# Patient Record
Sex: Female | Born: 1995
Health system: Southern US, Community
[De-identification: ages and names within clinical notes are randomized; demographics above are authoritative.]

## PROBLEM LIST (undated history)

## (undated) DIAGNOSIS — Z789 Other specified health status: Secondary | ICD-10-CM

## (undated) DIAGNOSIS — M25572 Pain in left ankle and joints of left foot: Secondary | ICD-10-CM

## (undated) DIAGNOSIS — Z87898 Personal history of other specified conditions: Secondary | ICD-10-CM

## (undated) HISTORY — PX: NO PAST SURGERIES: SHX2092

## (undated) HISTORY — DX: Personal history of other specified conditions: Z87.898

## (undated) HISTORY — DX: Other specified health status: Z78.9

## (undated) HISTORY — DX: Pain in left ankle and joints of left foot: M25.572

---
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
## 2014-08-29 NOTE — H&P (Signed)
L&D Evaluation:  History:  HPI 19yo G1 at 4335w0d by D=22wk U/S presents in labor.  [facility], notable for [facility].  PNL - A+, RI, VI, GC and chlam negative, GBS pos.   Presents with contractions   Patient's Medical History No Chronic Illness   Patient's Surgical History none   Medications Pre Natal Vitamins  Iron   Allergies NKDA   Social History none   Family History Non-Contributory   ROS:  ROS All systems were reviewed.  HEENT, CNS, GI, GU, Respiratory, CV, Renal and Musculoskeletal systems were found to be normal.   Exam:  Vital Signs stable   General no apparent distress   Mental Status clear   Chest normal effort   Abdomen gravid, non-tender   Estimated Fetal Weight Average for gestational age, 7#   Pelvic 3-4 to 5cm over 2hour observation per RN   Mebranes Intact   FHT normal rate with no decels, reactive NST   Ucx every 2-3 min   Skin dry   Impression:  Impression 19yo G1 at 6535w0d in labor.   Plan:  Plan EFM/NST, monitor contractions and for cervical change, antibiotics for GBBS prophylaxis   Electronic Signatures: Garnette GunnerStansbury Clipp, Ali LoweEryn K (MD)  (Signed 19-Mar-14 15:03)  Authored: L&D Evaluation   Last Updated: 19-Mar-14 15:03 by Garnette GunnerStansbury Clipp, Ali LoweEryn K (MD)

## 2015-04-22 LAB — HM PAP SMEAR

## 2017-11-21 ENCOUNTER — Emergency Department
Admission: EM | Admit: 2017-11-21 | Discharge: 2017-11-21 | Disposition: A | Discharge status: Home / Self Care | Payer: BLUE CROSS/BLUE SHIELD | Attending: Emergency Medicine | Admitting: Emergency Medicine

## 2017-11-21 ENCOUNTER — Other Ambulatory Visit: Payer: Self-pay

## 2017-11-21 ENCOUNTER — Emergency Department: Payer: BLUE CROSS/BLUE SHIELD

## 2017-11-21 DIAGNOSIS — M25572 Pain in left ankle and joints of left foot: Secondary | ICD-10-CM

## 2017-11-21 DIAGNOSIS — S93402A Sprain of unspecified ligament of left ankle, initial encounter: Secondary | ICD-10-CM | POA: Insufficient documentation

## 2017-11-21 DIAGNOSIS — Y998 Other external cause status: Secondary | ICD-10-CM | POA: Diagnosis not present

## 2017-11-21 DIAGNOSIS — Z79899 Other long term (current) drug therapy: Secondary | ICD-10-CM | POA: Insufficient documentation

## 2017-11-21 DIAGNOSIS — Y929 Unspecified place or not applicable: Secondary | ICD-10-CM | POA: Insufficient documentation

## 2017-11-21 DIAGNOSIS — Y9302 Activity, running: Secondary | ICD-10-CM | POA: Diagnosis not present

## 2017-11-21 DIAGNOSIS — S99912A Unspecified injury of left ankle, initial encounter: Secondary | ICD-10-CM | POA: Diagnosis present

## 2017-11-21 DIAGNOSIS — W010XXA Fall on same level from slipping, tripping and stumbling without subsequent striking against object, initial encounter: Secondary | ICD-10-CM | POA: Insufficient documentation

## 2017-11-21 HISTORY — DX: Pain in left ankle and joints of left foot: M25.572

## 2017-11-21 MED ORDER — MELOXICAM 15 MG PO TABS: 15.0000 mg | ORAL_TABLET | Freq: Every day | ORAL | 2 refills | Status: AC

## 2017-11-21 NOTE — ED Triage Notes (Signed)
Patient reports "I was running and I did something to my ankle".  Patient reports left ankle pain.

## 2017-11-21 NOTE — ED Provider Notes (Signed)
[facility] Emergency Department Provider Note  ____________________________________________  Time seen: Approximately 11:32 PM  I have reviewed the triage vital signs and the nursing notes.   HISTORY  Chief Complaint Ankle Pain    HPI Janet Kane is a 22 y.o. female presents to the emergency department with 10 out of 10 left ankle pain after patient sustained an inversion type left ankle injury while running.  Patient has had prior left ankle sprains while playing softball.  She denies numbness and tingling of the left foot.  Patient did not hit her head during injury.  No skin compromise.  Patient has ambulated with some difficulty since incident occurred.  No alleviating measures have been attempted.   No past medical history on file.  There are no active problems to display for this patient.   No past surgical history on file.  Prior to Admission medications   Medication Sig Start Date End Date Taking? Authorizing Provider  acetaminophen (TYLENOL) 500 MG tablet Take 1,000 mg by mouth every 6 (six) hours as needed for pain.    [provider]  ferrous sulfate 325 (65 FE) MG tablet Take 325 mg by mouth daily with breakfast.    [provider]  ibuprofen (ADVIL,MOTRIN) 200 MG tablet Take 200 mg by mouth every 6 (six) hours as needed for pain.    [provider]  levonorgestrel (MIRENA) 20 MCG/24HR IUD 1 each by Intrauterine route once.    [provider]  meloxicam (MOBIC) 15 MG tablet Take 1 tablet (15 mg total) by mouth daily. 11/21/17 12/21/17  Orvil Feil, PA-C  naproxen (NAPROSYN) 375 MG tablet Take 1 tablet (375 mg total) by mouth 2 (two) times daily. 02/14/13   Marlon Pel, PA-C  Prenatal Vit-Fe Fumarate-FA (PRENATAL MULTIVITAMIN) TABS tablet Take 1 tablet by mouth daily at 12 noon.    [provider]    Allergies Patient has no known allergies.  No family history on file.  Social  History Social History   Tobacco Use  . Smoking status: Never Smoker  Substance Use Topics  . Alcohol use: No  . Drug use: Not on file     Review of Systems  Constitutional: No fever/chills Eyes: No visual changes. No discharge ENT: No upper respiratory complaints. Cardiovascular: no chest pain. Respiratory: no cough. No SOB. Gastrointestinal: No abdominal pain.  No nausea, no vomiting.  No diarrhea.  No constipation. Musculoskeletal: Patient has left ankle pain.  Skin: Negative for rash, abrasions, lacerations, ecchymosis. Neurological: Negative for headaches, focal weakness or numbness.   ____________________________________________   PHYSICAL EXAM:  VITAL SIGNS: ED Triage Vitals  Enc Vitals Group     BP 11/21/17 2021 100/65     Pulse Rate 11/21/17 2021 98     Resp 11/21/17 2021 17     Temp 11/21/17 2021 98.7 F (37.1 C)     Temp Source 11/21/17 2021 Oral     SpO2 11/21/17 2021 97 %     Weight 11/21/17 2238 140 lb (63.5 kg)     Height 11/21/17 2238 5\' 6"  (1.676 m)     Head Circumference --      Peak Flow --      Pain Score 11/21/17 2021 7     Pain Loc --      Pain Edu? --      Excl. in GC? --      Constitutional: Alert and oriented. Well appearing and in no acute distress. Eyes: Conjunctivae  are normal. PERRL. EOMI. Head: Atraumatic. Cardiovascular: Normal rate, regular rhythm. Normal S1 and S2.  Good peripheral circulation. Respiratory: Normal respiratory effort without tachypnea or retractions. Lungs CTAB. Good air entry to the bases with no decreased or absent breath sounds. Musculoskeletal: Patient is able to perform limited range of motion at the left ankle, likely secondary to pain.  Patient has exquisite tenderness and edema over the anterior and posterior talofibular ligaments.  No significant pain over the deltoid ligament.  No pain with palpation over the metatarsals or over the calcaneus.  Palpable dorsalis pedis pulse, left. Neurologic:  Normal  speech and language. No gross focal neurologic deficits are appreciated.  Skin:  Skin is warm, dry and intact. No rash noted. Psychiatric: Mood and affect are normal. Speech and behavior are normal. Patient exhibits appropriate insight and judgement.   ____________________________________________   LABS (all labs ordered are listed, but only abnormal results are displayed)  Labs Reviewed - No data to display ____________________________________________  EKG   ____________________________________________  RADIOLOGY I personally viewed and evaluated these images as part of my medical decision making, as well as reviewing the written report by the radiologist  Dg Ankle Complete Left  Result Date: 11/21/2017 CLINICAL DATA:  Swelling after twisting injury this afternoon. EXAM: LEFT ANKLE COMPLETE - 3+ VIEW COMPARISON:  None. FINDINGS: Marked soft tissue swelling of the distal leg and ankle with small ankle joint effusion. No fracture nor joint dislocation. The base of fifth metatarsal appears intact. The tibiotalar, subtalar and midfoot articulations are congruent. IMPRESSION: Soft tissue swelling of the lateral left leg and ankle without acute osseous appearing abnormality. Electronically Signed   By: Tollie Ethavid  Kwon M.D.   On: 11/21/2017 20:44    ____________________________________________    PROCEDURES  Procedure(s) performed:    Procedures    Medications - No data to display   ____________________________________________   INITIAL IMPRESSION / ASSESSMENT AND PLAN / ED COURSE  Pertinent labs & imaging results that were available during my care of the patient were reviewed by me and considered in my medical decision making (see chart for details).  Review of the [place] CSRS was performed in accordance of the [facility] prior to dispensing any controlled drugs.      Assessment and plan Left ankle pain Patient presents to the emergency department with left ankle pain after  sustaining an inversion type ankle injury while running.   X-ray examination reveals no acute bony abnormality.  Physical exam findings are consistent with a left ankle sprain.  An Ace wrap was applied in the emergency department and patient was discharged with meloxicam after she denied the possibility of pregnancy. Patient also declined crutches.  She was advised to follow-up with podiatry as needed.  All patient questions were answered.    ____________________________________________  FINAL CLINICAL IMPRESSION(S) / ED DIAGNOSES  Final diagnoses:  Sprain of left ankle, unspecified ligament, initial encounter      NEW MEDICATIONS STARTED DURING THIS VISIT:  ED Discharge Orders        Ordered    meloxicam (MOBIC) 15 MG tablet  Daily     11/21/17 2239          This chart was dictated using voice recognition software/Dragon. Despite best efforts to proofread, errors can occur which can change the meaning. Any change was purely unintentional.    Orvil FeilWoods, Jakeisha Stricker M, PA-C 11/21/17 [id number]    Dionne BucySiadecki, Sebastian, MD 11/21/17 [phone]

## 2018-04-22 ENCOUNTER — Encounter: Payer: Self-pay | Admitting: Family Medicine

## 2018-04-22 ENCOUNTER — Other Ambulatory Visit: Payer: Self-pay

## 2018-04-23 ENCOUNTER — Encounter: Payer: Self-pay | Admitting: Family Medicine

## 2018-04-23 ENCOUNTER — Ambulatory Visit (INDEPENDENT_AMBULATORY_CARE_PROVIDER_SITE_OTHER): Payer: No Typology Code available for payment source | Admitting: Family Medicine

## 2018-04-23 VITALS — BP 120/60 | HR 84 | Ht 66.0 in | Wt 132.0 lb

## 2018-04-23 DIAGNOSIS — Z30011 Encounter for initial prescription of contraceptive pills: Secondary | ICD-10-CM

## 2018-04-23 DIAGNOSIS — Z7689 Persons encountering health services in other specified circumstances: Secondary | ICD-10-CM | POA: Diagnosis not present

## 2018-04-23 MED ORDER — DROSPIRENONE-ETHINYL ESTRADIOL 3-0.02 MG PO TABS: 1.0000 | ORAL_TABLET | Freq: Every day | ORAL | 6 refills | Status: DC

## 2018-04-23 NOTE — Progress Notes (Signed)
Date:  04/23/2018   Name:  Janet Kane   DOB:  [birth year]   MRN:  [id number]   Chief Complaint: Establish Care and Contraception (had IUD taken out July 2014. Has been using condoms since- would like to discuss further options. )  Patient is a 23 year old female who presents for a comprehensive physical exam. The patient reports the following problems: contraceptive . Health maintenance has been reviewed pap.   Review of Systems  Constitutional: Negative.  Negative for chills, fatigue, fever and unexpected weight change.  HENT: Negative for congestion, ear discharge, ear pain, rhinorrhea, sinus pressure, sneezing and sore throat.   Eyes: Negative for photophobia, pain, discharge, redness and itching.  Respiratory: Negative for cough, shortness of breath, wheezing and stridor.   Gastrointestinal: Negative for abdominal pain, blood in stool, constipation, diarrhea, nausea and vomiting.  Endocrine: Negative for cold intolerance, heat intolerance, polydipsia, polyphagia and polyuria.  Genitourinary: Negative for dysuria, flank pain, frequency, hematuria, menstrual problem, pelvic pain, urgency, vaginal bleeding and vaginal discharge.  Musculoskeletal: Negative for arthralgias, back pain and myalgias.  Skin: Negative for rash.  Allergic/Immunologic: Negative for environmental allergies and food allergies.  Neurological: Negative for dizziness, weakness, light-headedness, numbness and headaches.  Hematological: Negative for adenopathy. Does not bruise/bleed easily.  Psychiatric/Behavioral: Negative for dysphoric mood. The patient is not nervous/anxious.     Patient Active Problem List   Diagnosis Date Noted  . Acute chest pain 12/27/2013  . Anxiety 12/27/2013  . Health care maintenance 10/03/2011    No Known Allergies  History reviewed. No pertinent surgical history.  Social History   Tobacco Use  . Smoking status: Never Smoker  . Smokeless tobacco: Never Used    Substance Use Topics  . Alcohol use: Yes    Alcohol/week: 3.0 standard drinks    Types: 3 Standard drinks or equivalent per week  . Drug use: Never     Medication list has been reviewed and updated.  No outpatient medications have been marked as taking for the 04/23/18 encounter (Office Visit) with Duanne LimerickJones,  C, MD.    [facility]  PHQ - 2 Score 0  PHQ- 9 Score 0    Physical Exam Vitals signs and nursing note reviewed.  Constitutional:      General: She is not in acute distress.    Appearance: She is not diaphoretic.  HENT:     Head: Normocephalic and atraumatic.     Right Ear: External ear normal.     Left Ear: External ear normal.     Nose: Nose normal.  Eyes:     General:        Right eye: No discharge.        Left eye: No discharge.     Conjunctiva/sclera: Conjunctivae normal.     Pupils: Pupils are equal, round, and reactive to light.  Neck:     Musculoskeletal: Normal range of motion and neck supple.     Thyroid: No thyromegaly.     Vascular: No JVD.  Cardiovascular:     Rate and Rhythm: Normal rate and regular rhythm.     Heart sounds: Normal heart sounds. No murmur. No friction rub. No gallop.   Pulmonary:     Effort: Pulmonary effort is normal.     Breath sounds: Normal breath sounds.  Abdominal:     General: Bowel sounds are normal.     Palpations: Abdomen is soft. There is no mass.     Tenderness:  There is no abdominal tenderness. There is no guarding.  Musculoskeletal: Normal range of motion.  Lymphadenopathy:     Cervical: No cervical adenopathy.  Skin:    General: Skin is warm and dry.  Neurological:     Mental Status: She is alert.     Deep Tendon Reflexes: Reflexes are normal and symmetric.     BP 120/60   Pulse 84   Ht 5\' 6"  (1.676 m)   Wt 132 lb (59.9 kg)   LMP 04/03/2018 (Exact Date)   BMI 21.31 kg/m   Assessment and Plan: 1. Encounter for initial prescription of contraceptive pills Contraceptive methods discussed  with patient patient would like to start on oral contraception discussed the risks benefits information provided patient was started on the as 1 a day for 4 weeks with 6 refills patient is to set up for a Pap smear bimanual exam in the near future. - drospirenone-ethinyl estradiol (YAZ,GIANVI,LORYNA) 3-0.02 MG tablet; Take 1 tablet by mouth daily.  Dispense: 1 Package; Refill: 6  2. Encounter to establish care Patient to establish care with new physician.

## 2018-04-23 NOTE — Patient Instructions (Signed)
Oral Contraception Information Oral contraceptive pills (OCPs) are medicines taken to prevent pregnancy. OCPs are taken by mouth, and they work by:  Preventing the ovaries from releasing eggs.  Thickening mucus in the lower part of the uterus (cervix), which prevents sperm from entering the uterus.  Thinning the lining of the uterus (endometrium), which prevents a fertilized egg from attaching to the endometrium. OCPs are highly effective when taken exactly as prescribed. However, OCPs do not prevent STIs (sexually transmitted infections). Safe sex practices, such as using condoms while on an OCP, can help prevent STIs. Before starting OCPs Before you start taking OCPs, you may have a physical exam, blood test, and Pap test. However, you are not required to have a pelvic exam in order to be prescribed OCPs. Your health care provider will make sure you are a good candidate for oral contraception. OCPs are not a good option for certain women, including women who smoke and are older than 35 years, and women with a medical history of high blood pressure, deep vein thrombosis, pulmonary embolism, stroke, cardiovascular disease, or peripheral vascular disease. Discuss with your health care provider the possible side effects of the OCP you may be prescribed. When you start an OCP, be aware that it can take 2-3 months for your body to adjust to changes in hormone levels. Follow instructions from your health care provider about how to start taking your first cycle of OCPs. Depending on when you start the pill, you may need to use a backup form of birth control, such as condoms, during the first week. Make sure you know what steps to take if you ever forget to take the pill. Types of oral contraception  The most common types of birth control pills contain the hormones estrogen and progestin (synthetic progesterone) or progestin only. The combination pill This type of pill contains estrogen and progestin  hormones. Combination pills often come in packs of 21, 28, or 91 pills. For each pack, the last 7 pills may not contain hormones, which means you may stop taking the pills for 7 days. Menstrual bleeding occurs during the week that you do not take the pills or that you take the pills with no hormones in them. The minipill This type of pill contains the progestin hormone only. It comes in packs of 28 pills. All 28 pills contain the hormone. You take the pill every day. It is very important to take the pill at the same time each day. Advantages of oral contraceptive pills  Provides reliable and continuous contraception if taken as instructed.  May treat or decrease symptoms of: ? Menstrual period cramps. ? Irregular menstrual cycle or bleeding. ? Heavy menstrual flow. ? Abnormal uterine bleeding. ? Acne, depending on the type of pill. ? Polycystic ovarian syndrome. ? Endometriosis. ? Iron deficiency anemia. ? Premenstrual symptoms, including premenstrual dysphoric disorder.  May reduce the risk of endometrial and ovarian cancer.  Can be used as emergency contraception.  Prevents mislocated (ectopic) pregnancies and infections of the fallopian tubes. Things that can make oral contraceptive pills less effective OCPs can be less effective if:  You forget to take the pill at the same time every day. This is especially important when taking the minipill.  You have a stomach or intestinal disease that reduces your body's ability to absorb the pill.  You take OCPs with other medicines that make OCPs less effective, such as antibiotics, certain HIV medicines, and some seizure medicines.  You take expired OCPs.    You forget to restart the pill on day 7, if using the packs of 21 pills. Risks associated with oral contraceptive pills Oral contraceptive pills can sometimes cause side effects, such as:  Headache.  Depression.  Trouble sleeping.  Nausea and vomiting.  Breast  tenderness.  Irregular bleeding or spotting during the first several months.  Bloating or fluid retention.  Increase in blood pressure. Combination pills are also associated with a small increase in the risk of:  Blood clots.  Heart attack.  Stroke. Summary  Oral contraceptive pills are medicines taken by mouth to prevent pregnancy. They are highly effective when taken exactly as prescribed.  The most common types of birth control pills contain the hormones estrogen and progestin (synthetic progesterone) or progestin only.  Before you start taking the pill, you may have a physical exam, blood test, and Pap test. Your health care provider will make sure you are a good candidate for oral contraception.  The combination pill may come in a 21-day pack, a 28-day pack, or a 91-day pack. The minipill contains the progesterone hormone only and comes in packs of 28 pills.  Oral contraceptive pills can sometimes cause side effects, such as headache, nausea, breast tenderness, or irregular bleeding. This information is not intended to replace advice given to you by your health care provider. Make sure you discuss any questions you have with your health care provider. Document Released: 06/28/2002 Document Revised: 07/01/2016 Document Reviewed: 07/01/2016 Elsevier Interactive Patient Education  2019 [employer] Drospirenone; Ethinyl Estradiol tablets What is this medicine? DROSPIRENONE; ETHINYL ESTRADIOL (dro SPY re nown; ETH in il es tra DYE ole) is an oral contraceptive (birth control pill). This medicine combines two types of female hormones, an estrogen and a progestin. It is used to prevent ovulation and pregnancy. This medicine may be used for other purposes; ask your health care provider or pharmacist if you have questions. COMMON BRAND NAME(S): Ovidio Hanger, Lo-Zumandimine, Elmer Ramp 28-Day, Ocella, Syeda, Vestura, Alphonse Guild, Zumandimine What should I tell my health  care provider before I take this medicine? They need to know if you have or ever had any of these conditions: -abnormal vaginal bleeding -adrenal gland disease -blood vessel disease or blood clots -breast, cervical, endometrial, ovarian, liver, or uterine cancer -diabetes -gallbladder disease -heart disease or recent heart attack -high blood pressure -high cholesterol -high potassium level -kidney disease -liver disease -migraine headaches -stroke -systemic lupus erythematosus (SLE) -tobacco smoker -an unusual or allergic reaction to estrogens, progestins, or other medicines, foods, dyes, or preservatives -pregnant or trying to get pregnant -breast-feeding How should I use this medicine? Take this medicine by mouth. To reduce nausea, this medicine may be taken with food. Follow the directions on the prescription label. Take this medicine at the same time each day and in the order directed on the package. Do not take your medicine more often than directed. A patient package insert for the product will be given with each prescription and refill. Read this sheet carefully each time. The sheet may change frequently. Talk to your pediatrician regarding the use of this medicine in children. Special care may be needed. This medicine has been used in female children who have started having menstrual periods. Overdosage: If you think you have taken too much of this medicine contact a poison control center or emergency room at once. NOTE: This medicine is only for you. Do not share this medicine with others. What if I miss a dose? If you miss a  dose, refer to the patient information sheet you received with your medicine for direction. If you miss more than one pill, this medicine may not be as effective and you may need to use another form of birth control. What may interact with this medicine? Do not take this medicine with any of the following medications: -aminoglutethimide -amprenavir,  fosamprenavir -atazanavir; cobicistat -anastrozole -bosentan -exemestane -letrozole -metyrapone -testolactone This medicine may also interact with the following medications: -acetaminophen -antiviral medicines for HIV or AIDS -aprepitant -barbiturates -certain antibiotics like rifampin, rifabutin, rifapentine, and possibly penicillins or tetracyclines -certain diuretics like amiloride, spironolactone, triamterene -certain medicines for fungal infections like griseofulvin, ketoconazole, itraconazole -certain medications for high blood pressure or heart conditions like ACE-inhibitors, Angiotensin-II receptor blockers, eplerenone -certain medicines for seizures like carbamazepine, oxcarbazepine, phenobarbital, phenytoin -cholestyramine -cobicistat -corticosteroid like hydrocortisone and prednisolone -cyclosporine -dantrolene -felbamate -grapefruit juice -heparin -lamotrigine -medicines for diabetes, including pioglitazone -modafinil -NSAIDs -potassium supplements -pyrimethamine -raloxifene -St. John's wort -sulfasalazine -tamoxifen -topiramate -thyroid hormones -warfarin his list may not describe all possible interactions. Give your health care provider a list of all the medicines, herbs, non-prescription drugs, or dietary supplements you use. Also tell them if you smoke, drink alcohol, or use illegal drugs. Some items may interact with your medicine. This list may not describe all possible interactions. Give your health care provider a list of all the medicines, herbs, non-prescription drugs, or dietary supplements you use. Also tell them if you smoke, drink alcohol, or use illegal drugs. Some items may interact with your medicine. What should I watch for while using this medicine? Visit your doctor or health care professional for regular checks on your progress. You will need a regular breast and pelvic exam and Pap smear while on this medicine. Use an additional method of  contraception during the first cycle that you take these tablets. If you have any reason to think you are pregnant, stop taking this medicine right away and contact your doctor or health care professional. If you are taking this medicine for hormone related problems, it may take several cycles of use to see improvement in your condition. Smoking increases the risk of getting a blood clot or having a stroke while you are taking birth control pills, especially if you are more than 24 years old. You are strongly advised not to smoke. This medicine can make your body retain fluid, making your fingers, hands, or ankles swell. Your blood pressure can go up. Contact your doctor or health care professional if you feel you are retaining fluid. This medicine can make you more sensitive to the sun. Keep out of the sun. If you cannot avoid being in the sun, wear protective clothing and use sunscreen. Do not use sun lamps or tanning beds/booths. If you wear contact lenses and notice visual changes, or if the lenses begin to feel uncomfortable, consult your eye care specialist. In some women, tenderness, swelling, or minor bleeding of the gums may occur. Notify your dentist if this happens. Brushing and flossing your teeth regularly may help limit this. See your dentist regularly and inform your dentist of the medicines you are taking. If you are going to have elective surgery, you may need to stop taking this medicine before the surgery. Consult your health care professional for advice. This medicine does not protect you against HIV infection (AIDS) or any other sexually transmitted diseases. What side effects may I notice from receiving this medicine? Side effects that you should report to your doctor or health care  professional as soon as possible: -allergic reactions like skin rash, itching or hives, swelling of the face, lips, or tongue -breast tissue changes or discharge -changes in vision -chest  pain -confusion, trouble speaking or understanding -dark urine -general ill feeling or flu-like symptoms -light-colored stools -nausea, vomiting -pain, swelling, warmth in the leg -right upper belly pain -severe headaches -shortness of breath -sudden numbness or weakness of the face, arm or leg -trouble walking, dizziness, loss of balance or coordination -unusual vaginal bleeding -yellowing of the eyes or skin Side effects that usually do not require medical attention (report to your doctor or health care professional if they continue or are bothersome): -acne -brown spots on the face -change in appetite -change in sexual desire -depressed mood or mood swings -fluid retention and swelling -stomach cramps or bloating -unusually weak or tired -weight gain This list may not describe all possible side effects. Call your doctor for medical advice about side effects. You may report side effects to FDA at 1-800-FDA-1088. Where should I keep my medicine? Keep out of the reach of children. Store at room temperature between 15 and 30 degrees C (59 and 86 degrees F). Throw away any unused medicine after the expiration date. NOTE: This sheet is a summary. It may not cover all possible information. If you have questions about this medicine, talk to your doctor, pharmacist, or health care provider.  2019 Elsevier/Gold Standard (2015-12-28 13:52:56)

## 2018-05-14 ENCOUNTER — Other Ambulatory Visit: Payer: Self-pay

## 2018-09-02 ENCOUNTER — Telehealth: Payer: No Typology Code available for payment source | Admitting: Physician Assistant

## 2018-09-02 DIAGNOSIS — R300 Dysuria: Secondary | ICD-10-CM

## 2018-09-02 MED ORDER — CEPHALEXIN 500 MG PO CAPS: 500.0000 mg | ORAL_CAPSULE | Freq: Two times a day (BID) | ORAL | 0 refills | Status: AC

## 2018-09-02 NOTE — Progress Notes (Signed)

## 2018-09-02 NOTE — Progress Notes (Signed)
I have spent 5 minutes in review of e-visit questionnaire, review and updating patient chart, medical decision making and response to patient.   Kandise Riehle Cody Kimi Kroft, PA-C    

## 2018-10-21 ENCOUNTER — Other Ambulatory Visit: Payer: Self-pay

## 2018-10-21 DIAGNOSIS — Z30011 Encounter for initial prescription of contraceptive pills: Secondary | ICD-10-CM

## 2018-10-21 MED ORDER — DROSPIRENONE-ETHINYL ESTRADIOL 3-0.02 MG PO TABS: 1.0000 | ORAL_TABLET | Freq: Every day | ORAL | 0 refills | Status: DC

## 2018-11-09 ENCOUNTER — Other Ambulatory Visit (HOSPITAL_COMMUNITY)
Admission: RE | Admit: 2018-11-09 | Discharge: 2018-11-09 | Disposition: A | Discharge status: Home / Self Care | Payer: No Typology Code available for payment source | Source: Ambulatory Visit | Attending: Advanced Practice Midwife | Admitting: Advanced Practice Midwife

## 2018-11-09 ENCOUNTER — Other Ambulatory Visit: Payer: Self-pay

## 2018-11-09 ENCOUNTER — Ambulatory Visit (INDEPENDENT_AMBULATORY_CARE_PROVIDER_SITE_OTHER): Payer: No Typology Code available for payment source | Admitting: Advanced Practice Midwife

## 2018-11-09 ENCOUNTER — Encounter: Payer: Self-pay | Admitting: Advanced Practice Midwife

## 2018-11-09 VITALS — BP 122/74 | Ht 66.0 in | Wt 140.0 lb

## 2018-11-09 DIAGNOSIS — Z124 Encounter for screening for malignant neoplasm of cervix: Secondary | ICD-10-CM

## 2018-11-09 DIAGNOSIS — Z113 Encounter for screening for infections with a predominantly sexual mode of transmission: Secondary | ICD-10-CM | POA: Diagnosis present

## 2018-11-09 DIAGNOSIS — Z01419 Encounter for gynecological examination (general) (routine) without abnormal findings: Secondary | ICD-10-CM | POA: Insufficient documentation

## 2018-11-09 DIAGNOSIS — Z3041 Encounter for surveillance of contraceptive pills: Secondary | ICD-10-CM

## 2018-11-09 MED ORDER — DROSPIRENONE-ETHINYL ESTRADIOL 3-0.02 MG PO TABS: 1.0000 | ORAL_TABLET | Freq: Every day | ORAL | 4 refills | Status: DC

## 2018-11-09 NOTE — Patient Instructions (Signed)
Health Maintenance, Female Adopting a healthy lifestyle and getting preventive care are important in promoting health and wellness. Ask your health care provider about:  The right schedule for you to have regular tests and exams.  Things you can do on your own to prevent diseases and keep yourself healthy. What should I know about diet, weight, and exercise? Eat a healthy diet   Eat a diet that includes plenty of vegetables, fruits, low-fat dairy products, and lean protein.  Do not eat a lot of foods that are high in solid fats, added sugars, or sodium. Maintain a healthy weight Body mass index (BMI) is used to identify weight problems. It estimates body fat based on height and weight. Your health care provider can help determine your BMI and help you achieve or maintain a healthy weight. Get regular exercise Get regular exercise. This is one of the most important things you can do for your health. Most adults should:  Exercise for at least 150 minutes each week. The exercise should increase your heart rate and make you sweat (moderate-intensity exercise).  Do strengthening exercises at least twice a week. This is in addition to the moderate-intensity exercise.  Spend less time sitting. Even light physical activity can be beneficial. Watch cholesterol and blood lipids Have your blood tested for lipids and cholesterol at 24 years of age, then have this test every 5 years. Have your cholesterol levels checked more often if:  Your lipid or cholesterol levels are high.  You are older than 24 years of age.  You are at high risk for heart disease. What should I know about cancer screening? Depending on your health history and family history, you may need to have cancer screening at various ages. This may include screening for:  Breast cancer.  Cervical cancer.  Colorectal cancer.  Skin cancer.  Lung cancer. What should I know about heart disease, diabetes, and high blood  pressure? Blood pressure and heart disease  High blood pressure causes heart disease and increases the risk of stroke. This is more likely to develop in people who have high blood pressure readings, are of African descent, or are overweight.  Have your blood pressure checked: ? Every 3-5 years if you are 18-39 years of age. ? Every year if you are 40 years old or older. Diabetes Have regular diabetes screenings. This checks your fasting blood sugar level. Have the screening done:  Once every three years after age 40 if you are at a normal weight and have a low risk for diabetes.  More often and at a younger age if you are overweight or have a high risk for diabetes. What should I know about preventing infection? Hepatitis B If you have a higher risk for hepatitis B, you should be screened for this virus. Talk with your health care provider to find out if you are at risk for hepatitis B infection. Hepatitis C Testing is recommended for:  Everyone born from 1945 through 1965.  Anyone with known risk factors for hepatitis C. Sexually transmitted infections (STIs)  Get screened for STIs, including gonorrhea and chlamydia, if: ? You are sexually active and are younger than 24 years of age. ? You are older than 24 years of age and your health care provider tells you that you are at risk for this type of infection. ? Your sexual activity has changed since you were last screened, and you are at increased risk for chlamydia or gonorrhea. Ask your health care provider if   you are at risk.  Ask your health care provider about whether you are at high risk for HIV. Your health care provider may recommend a prescription medicine to help prevent HIV infection. If you choose to take medicine to prevent HIV, you should first get tested for HIV. You should then be tested every 3 months for as long as you are taking the medicine. Pregnancy  If you are about to stop having your period (premenopausal) and  you may become pregnant, seek counseling before you get pregnant.  Take 400 to 800 micrograms (mcg) of folic acid every day if you become pregnant.  Ask for birth control (contraception) if you want to prevent pregnancy. Osteoporosis and menopause Osteoporosis is a disease in which the bones lose minerals and strength with aging. This can result in bone fractures. If you are 65 years old or older, or if you are at risk for osteoporosis and fractures, ask your health care provider if you should:  Be screened for bone loss.  Take a calcium or vitamin D supplement to lower your risk of fractures.  Be given hormone replacement therapy (HRT) to treat symptoms of menopause. Follow these instructions at home: Lifestyle  Do not use any products that contain nicotine or tobacco, such as cigarettes, e-cigarettes, and chewing tobacco. If you need help quitting, ask your health care provider.  Do not use street drugs.  Do not share needles.  Ask your health care provider for help if you need support or information about quitting drugs. Alcohol use  Do not drink alcohol if: ? Your health care provider tells you not to drink. ? You are pregnant, may be pregnant, or are planning to become pregnant.  If you drink alcohol: ? Limit how much you use to 0-1 drink a day. ? Limit intake if you are breastfeeding.  Be aware of how much alcohol is in your drink. In the [place] one drink equals one 12 oz bottle of beer (355 mL), one 5 oz glass of wine (148 mL), or one 1 oz glass of hard liquor (44 mL). General instructions  Schedule regular health, dental, and eye exams.  Stay current with your vaccines.  Tell your health care provider if: ? You often feel depressed. ? You have ever been abused or do not feel safe at home. Summary  Adopting a healthy lifestyle and getting preventive care are important in promoting health and wellness.  Follow your health care provider's instructions about healthy  diet, exercising, and getting tested or screened for diseases.  Follow your health care provider's instructions on monitoring your cholesterol and blood pressure. This information is not intended to replace advice given to you by your health care provider. Make sure you discuss any questions you have with your health care provider. Document Released: 10/21/2010 Document Revised: 03/31/2018 Document Reviewed: 03/31/2018 Elsevier Patient Education  2020 [employer]  

## 2018-11-09 NOTE — Progress Notes (Signed)
Gynecology Annual Exam  PCP: [name], MD  Chief Complaint:  Chief Complaint  Patient presents with  . Annual Exam  . Contraception    History of Present Illness: Patient is a 23 y.o. G1P1001 presents for annual exam. The patient has no gyn complaints today. She does wonder if it is normal to have cyclical changes in the amount of vaginal discharge. Discussed normal changes in cervical mucous/discharge.   LMP: Patient's last menstrual period was 10/04/2018. Menarche:13 Average Interval: regular, 28 days Duration of flow: 4 days Heavy Menses: no Clots: no Intermenstrual Bleeding: no Postcoital Bleeding: no Dysmenorrhea: no  The patient is sexually active. She currently uses OCP (estrogen/progesterone) for contraception. She denies dyspareunia.  The patient does perform self breast exams.  There is no notable family history of breast or ovarian cancer in her family.  The patient wears seatbelts: yes.  The patient has regular exercise: she runs 3 days per week and is active at her job and with her 72 year old daughter. She admits healthy diet, adequate hydration and adequate sleep although she works night shift.  She is an Therapist, sports at [employer].  The patient denies current symptoms of depression.    Review of Systems: Review of Systems  Constitutional: Negative.   HENT: Negative.   Eyes: Negative.   Respiratory: Negative.   Cardiovascular: Negative.   Gastrointestinal: Negative.   Genitourinary: Negative.   Musculoskeletal: Negative.   Skin: Negative.   Neurological: Negative.   Endo/Heme/Allergies: Negative.   Psychiatric/Behavioral: Negative.     Past Medical History:  Past Medical History:  Diagnosis Date  . Hx of chest pain   . Left ankle pain 11/21/2017  . Uses birth control     Past Surgical History:  History reviewed. No pertinent surgical history.  Gynecologic History:  Patient's last menstrual period was 10/04/2018. Contraception: OCP  (estrogen/progesterone) Last Pap: 4 years ago Results were:  no abnormalities   Obstetric History: G1P1001  Family History:  History reviewed. No pertinent family history.  Social History:  Social History   Socioeconomic History  . Marital status: Single    Spouse name: Not on file  . Number of children: Not on file  . Years of education: Not on file  . Highest education level: Not on file  Occupational History  . Not on file  Social Needs  . Financial resource strain: Not on file  . Food insecurity    Worry: Not on file    Inability: Not on file  . Transportation needs    Medical: Not on file    Non-medical: Not on file  Tobacco Use  . Smoking status: Never Smoker  . Smokeless tobacco: Never Used  Substance and Sexual Activity  . Alcohol use: Yes    Alcohol/week: 3.0 standard drinks    Types: 3 Standard drinks or equivalent per week  . Drug use: Never  . Sexual activity: Yes    Birth control/protection: Pill  Lifestyle  . Physical activity    Days per week: Not on file    Minutes per session: Not on file  . Stress: Not on file  Relationships  . Social Herbalist on phone: Not on file    Gets together: Not on file    Attends religious service: Not on file    Active member of club or organization: Not on file    Attends meetings of clubs or organizations: Not on file  Relationship status: Not on file  . Intimate partner violence    Fear of current or ex partner: Not on file    Emotionally abused: Not on file    Physically abused: Not on file    Forced sexual activity: Not on file  Other Topics Concern  . Not on file  Social History Narrative  . Not on file    Allergies:  No Known Allergies  Medications: Prior to Admission medications   Medication Sig Start Date End Date Taking? Authorizing Provider  drospirenone-ethinyl estradiol (YAZ) 3-0.02 MG tablet Take 1 tablet by mouth daily. 11/09/18   Tresea MallGledhill, Cheryel Kyte, CNM    Physical Exam Vitals:  Blood pressure 122/74, height 5\' 6"  (1.676 m), weight 140 lb (63.5 kg), last menstrual period 10/04/2018.  General: NAD HEENT: normocephalic, anicteric Thyroid: no enlargement, no palpable nodules Pulmonary: No increased work of breathing, CTAB Cardiovascular: RRR, distal pulses 2+ Breast: Breast symmetrical, no tenderness, no palpable nodules or masses, no skin or nipple retraction present, no nipple discharge.  No axillary or supraclavicular lymphadenopathy. Abdomen: NABS, soft, non-tender, non-distended.  Umbilicus without lesions.  No hepatomegaly, splenomegaly or masses palpable. No evidence of hernia  Genitourinary:  External: Normal external female genitalia.  Normal urethral meatus, normal Bartholin's and Skene's glands.    Vagina: Normal vaginal mucosa, no evidence of prolapse.    Cervix: Grossly normal in appearance, no bleeding  Uterus: deferred for no concerns  Adnexa: deferred for no concerns  Rectal: deferred  Lymphatic: no evidence of inguinal lymphadenopathy Extremities: no edema, erythema, or tenderness Neurologic: Grossly intact Psychiatric: mood appropriate, affect full    Assessment: 23 y.o. G1P1001 routine annual exam  Plan: Problem List Items Addressed This Visit    None    Visit Diagnoses    Well woman exam with routine gynecological exam    -  Primary   Relevant Orders   Cytology - PAP   Cervical cancer screening       Relevant Orders   Cytology - PAP   Screen for sexually transmitted diseases       Relevant Orders   Cytology - PAP   Encounter for surveillance of contraceptive pills       Relevant Medications   drospirenone-ethinyl estradiol (YAZ) 3-0.02 MG tablet      1) 4) Gardasil Series discussed and if applicable offered to patient - Patient has not previously completed 3 shot series   2) STI screening  was offered and accepted  3)  ASCCP guidelines and rationale discussed.  Patient opts for every 3 years screening interval  4)  Contraception - the patient is currently using  OCP (estrogen/progesterone).  She is happy with her current form of contraception and plans to continue We discussed safe sex practices to reduce her furture risk of STI's.    5) Return in about 1 year (around 11/09/2019) for annual established gyn.    Tresea MallJane Yaseen Gilberg, CNM [facility] 11/09/2018, 4:33 PM

## 2018-11-13 LAB — CYTOLOGY - PAP
Chlamydia: NEGATIVE
Diagnosis: UNDETERMINED — AB
HPV: NOT DETECTED
Neisseria Gonorrhea: NEGATIVE
Trichomonas: NEGATIVE

## 2018-11-23 ENCOUNTER — Other Ambulatory Visit: Payer: Self-pay

## 2019-03-07 ENCOUNTER — Telehealth: Payer: No Typology Code available for payment source | Admitting: Physician Assistant

## 2019-03-07 DIAGNOSIS — R300 Dysuria: Secondary | ICD-10-CM | POA: Diagnosis not present

## 2019-03-07 MED ORDER — CEPHALEXIN 500 MG PO CAPS: 500.0000 mg | ORAL_CAPSULE | Freq: Two times a day (BID) | ORAL | 0 refills | Status: AC

## 2019-03-07 NOTE — Progress Notes (Signed)

## 2019-03-11 ENCOUNTER — Telehealth: Payer: Self-pay

## 2019-03-11 NOTE — Telephone Encounter (Signed)
Spoke w/patient. Advised I have changed pharamcy in Epic. Advised her to contact [facility] and let them know she has an active rx with refill at [facility] that she would like to transfer. If she has any issues, she will contact us back to send remaining [facility] of [facility].

## 2019-03-11 NOTE — Telephone Encounter (Signed)
Patient has switched pharmacies from [facility] to [facility] in [place]. [phone]

## 2019-03-23 ENCOUNTER — Telehealth: Payer: No Typology Code available for payment source | Admitting: Family

## 2019-03-23 DIAGNOSIS — N898 Other specified noninflammatory disorders of vagina: Secondary | ICD-10-CM

## 2019-03-23 DIAGNOSIS — Z202 Contact with and (suspected) exposure to infections with a predominantly sexual mode of transmission: Secondary | ICD-10-CM

## 2019-03-23 NOTE — Progress Notes (Signed)
Based on what you shared with me, I feel your condition warrants further evaluation and I recommend that you be seen for a face to face office visit.   Given your symptoms, you need to be seen face to face to rule out STD's.    NOTE: If you entered your credit card information for this eVisit, you will not be charged. You may see a "hold" on your card for the $35 but that hold will drop off and you will not have a charge processed.   If you are having a true medical emergency please call 911.      For an urgent face to face visit, [place] has five urgent care centers for your convenience:      NEW:  [facility] [place] at [place] Get Driving Directions [phone] [place], [place] [address] . 10 am - 6pm Monday - Friday    [place] [facility] [place] [facility]) Get Driving Directions [phone] [address] [place], [place] [address] . 10 am to 8 pm Monday-Friday . 12 pm to 8 pm [facility] at [facility] Get Driving Directions [phone] [place], [place], [place] [address] . 8 am to 8 pm Monday-Friday . 9 am to 6 pm Saturday . 11 am to 6 pm Sunday     [facility] at [facility] Get Driving Directions  [phone] [address], [place] [address] . 8 am to 8 pm Monday-Friday . 8 am to 4 pm [facility] at [place] Get Driving Directions [phone] [address] [place], [place] [address] . 12 pm to 6 pm Monday-Friday      Your e-visit answers were reviewed by a board certified advanced clinical practitioner to complete your personal care plan.  Thank you for using e-Visits.

## 2019-03-25 ENCOUNTER — Encounter: Payer: Self-pay | Admitting: Obstetrics and Gynecology

## 2019-03-25 ENCOUNTER — Other Ambulatory Visit: Payer: Self-pay

## 2019-03-25 ENCOUNTER — Other Ambulatory Visit: Payer: Self-pay | Admitting: Obstetrics and Gynecology

## 2019-03-25 ENCOUNTER — Ambulatory Visit (INDEPENDENT_AMBULATORY_CARE_PROVIDER_SITE_OTHER): Payer: No Typology Code available for payment source | Admitting: Obstetrics and Gynecology

## 2019-03-25 VITALS — BP 102/60 | HR 82 | Ht 66.0 in | Wt 131.0 lb

## 2019-03-25 DIAGNOSIS — B9689 Other specified bacterial agents as the cause of diseases classified elsewhere: Secondary | ICD-10-CM | POA: Diagnosis not present

## 2019-03-25 DIAGNOSIS — Z113 Encounter for screening for infections with a predominantly sexual mode of transmission: Secondary | ICD-10-CM

## 2019-03-25 DIAGNOSIS — N760 Acute vaginitis: Secondary | ICD-10-CM | POA: Diagnosis not present

## 2019-03-25 MED ORDER — METRONIDAZOLE 500 MG PO TABS: 500.0000 mg | ORAL_TABLET | Freq: Two times a day (BID) | ORAL | 0 refills | Status: AC

## 2019-03-25 NOTE — Progress Notes (Signed)
Obstetrics & Gynecology Office Visit   Chief Complaint:  Chief Complaint  Patient presents with  . STD screening    History of Present Illness: [name] is a 23 y.o. G1P1001 who LMP was Patient's last menstrual period was 03/07/2019., presents today for a problem visit.   Patient complains of an abnormal vaginal discharge for past week. Discharge described as: thin. Vaginal symptoms include odor.   Other associated symptoms: none.  She denies recent antibiotic exposure, denies changes in soaps, detergents coinciding with the onset of her symptoms.  She has not previously self treated or been under treatment by another provider for these symptoms.   Review of Systems: review of systems negative unless noted in HPI  Past Medical History:  Past Medical History:  Diagnosis Date  . Hx of chest pain   . Left ankle pain 11/21/2017  . Uses birth control     Past Surgical History:  History reviewed. No pertinent surgical history.  Gynecologic History: Patient's last menstrual period was 03/07/2019.  Obstetric History: G1P1001  Family History:  History reviewed. No pertinent family history.  Social History:  Social History   Socioeconomic History  . Marital status: Single    Spouse name: Not on file  . Number of children: Not on file  . Years of education: Not on file  . Highest education level: Not on file  Occupational History  . Not on file  Social Needs  . Financial resource strain: Not on file  . Food insecurity    Worry: Not on file    Inability: Not on file  . Transportation needs    Medical: Not on file    Non-medical: Not on file  Tobacco Use  . Smoking status: Never Smoker  . Smokeless tobacco: Never Used  Substance and Sexual Activity  . Alcohol use: Yes    Alcohol/week: 3.0 standard drinks    Types: 3 Standard drinks or equivalent per week  . Drug use: Never  . Sexual activity: Yes    Birth control/protection: Pill  Lifestyle  .  Physical activity    Days per week: Not on file    Minutes per session: Not on file  . Stress: Not on file  Relationships  . Social Musician on phone: Not on file    Gets together: Not on file    Attends religious service: Not on file    Active member of club or organization: Not on file    Attends meetings of clubs or organizations: Not on file    Relationship status: Not on file  . Intimate partner violence    Fear of current or ex partner: Not on file    Emotionally abused: Not on file    Physically abused: Not on file    Forced sexual activity: Not on file  Other Topics Concern  . Not on file  Social History Narrative  . Not on file    Allergies:  No Known Allergies  Medications: Prior to Admission medications   Medication Sig Start Date End Date Taking? Authorizing Provider  drospirenone-ethinyl estradiol (YAZ) 3-0.02 MG tablet Take 1 tablet by mouth daily. 11/09/18  Yes Tresea Mall, CNM  metroNIDAZOLE (FLAGYL) 500 MG tablet Take 1 tablet (500 mg total) by mouth 2 (two) times daily for 7 days. 03/25/19 04/01/19  Vena Austria, MD    Physical Exam Vitals:  Vitals:   03/25/19 1440  BP: 102/60  Pulse: 82  Patient's last menstrual period was 03/07/2019.  General: NAD HEENT: normocephalic, anicteric Pulmonary: No increased work of breathing Genitourinary:  External: Normal external female genitalia.  Normal urethral meatus, normal  Bartholin's and Skene's glands.    Vagina: Normal vaginal mucosa, no evidence of prolapse.    Cervix: Grossly normal in appearance, no bleeding  Uterus: Non-enlarged, mobile, normal contour.  No CMT  Adnexa: ovaries non-enlarged, no adnexal masses  Rectal: deferred  Lymphatic: no evidence of inguinal lymphadenopathy Extremities: no edema, erythema, or tenderness Neurologic: Grossly intact Psychiatric: mood appropriate, affect full  Female chaperone present for pelvic  portions of the physical exam  Assessment: 23  y.o. G1P1001 with bacterial vaginosis  Plan: Problem List Items Addressed This Visit    None    Visit Diagnoses    Routine screening for STI (sexually transmitted infection)    -  Primary   Relevant Orders   NuSwab Vaginitis Plus (VG+)   Bacterial vaginosis       Relevant Medications   metroNIDAZOLE (FLAGYL) 500 MG tablet   Other Relevant Orders   NuSwab Vaginitis Plus (VG+)      1) Risk factors for bacterial vaginosis and candida infections discussed.  We discussed normal vaginal flora/microbiome.  Any factors that may alter the microbiome increase the risk of these opportunistic infections.  These include changes in pH, antibiotic exposures, diabetes, wet bathing suits etc.  We discussed that treatment is aimed at eradicating abnormal bacterial overgrowth and or yeast.  There may be some role for vaginal probiotics in restoring normal vaginal flora.    Malachy Mood, MD, [facility], [place]

## 2019-03-25 NOTE — Patient Instructions (Signed)
Bacterial Vaginosis  Bacterial vaginosis is a vaginal infection that occurs when the normal balance of bacteria in the vagina is disrupted. It results from an overgrowth of certain bacteria. This is the most common vaginal infection among women ages 15-44. Because bacterial vaginosis increases your risk for STIs (sexually transmitted infections), getting treated can help reduce your risk for chlamydia, gonorrhea, herpes, and HIV (human immunodeficiency virus). Treatment is also important for preventing complications in pregnant women, because this condition can cause an early (premature) delivery. What are the causes? This condition is caused by an increase in harmful bacteria that are normally present in small amounts in the vagina. However, the reason that the condition develops is not fully understood. What increases the risk? The following factors may make you more likely to develop this condition:  Having a new sexual partner or multiple sexual partners.  Having unprotected sex.  Douching.  Having an intrauterine device (IUD).  Smoking.  Drug and alcohol abuse.  Taking certain antibiotic medicines.  Being pregnant. You cannot get bacterial vaginosis from toilet seats, bedding, swimming pools, or contact with objects around you. What are the signs or symptoms? Symptoms of this condition include:  Grey or white vaginal discharge. The discharge can also be watery or foamy.  A fish-like odor with discharge, especially after sexual intercourse or during menstruation.  Itching in and around the vagina.  Burning or pain with urination. Some women with bacterial vaginosis have no signs or symptoms. How is this diagnosed? This condition is diagnosed based on:  Your medical history.  A physical exam of the vagina.  Testing a sample of vaginal fluid under a microscope to look for a large amount of bad bacteria or abnormal cells. Your health care provider may use a cotton swab or  a small wooden spatula to collect the sample. How is this treated? This condition is treated with antibiotics. These may be given as a pill, a vaginal cream, or a medicine that is put into the vagina (suppository). If the condition comes back after treatment, a second round of antibiotics may be needed. Follow these instructions at home: Medicines  Take over-the-counter and prescription medicines only as told by your health care provider.  Take or use your antibiotic as told by your health care provider. Do not stop taking or using the antibiotic even if you start to feel better. General instructions  If you have a female sexual partner, tell her that you have a vaginal infection. She should see her health care provider and be treated if she has symptoms. If you have a female sexual partner, he does not need treatment.  During treatment: ? Avoid sexual activity until you finish treatment. ? Do not douche. ? Avoid alcohol as directed by your health care provider. ? Avoid breastfeeding as directed by your health care provider.  Drink enough water and fluids to keep your urine clear or pale yellow.  Keep the area around your vagina and rectum clean. ? Wash the area daily with warm water. ? Wipe yourself from front to back after using the toilet.  Keep all follow-up visits as told by your health care provider. This is important. How is this prevented?  Do not douche.  Wash the outside of your vagina with warm water only.  Use protection when having sex. This includes latex condoms and dental dams.  Limit how many sexual partners you have. To help prevent bacterial vaginosis, it is best to have sex with just one partner (  monogamous).  Make sure you and your sexual partner are tested for STIs.  Wear cotton or cotton-lined underwear.  Avoid wearing tight pants and pantyhose, especially during summer.  Limit the amount of alcohol that you drink.  Do not use any products that contain  nicotine or tobacco, such as cigarettes and e-cigarettes. If you need help quitting, ask your health care provider.  Do not use illegal drugs. Where to find more information  Centers for Disease Control and Prevention: www.cdc.gov/std  American Sexual Health Association (ASHA): www.ashastd.org  U.S. Department of Health and Human Services, Office on Women's Health: www.womenshealth.gov/ or https://www.womenshealth.gov/a-z-topics/bacterial-vaginosis Contact a health care provider if:  Your symptoms do not improve, even after treatment.  You have more discharge or pain when urinating.  You have a fever.  You have pain in your abdomen.  You have pain during sex.  You have vaginal bleeding between periods. Summary  Bacterial vaginosis is a vaginal infection that occurs when the normal balance of bacteria in the vagina is disrupted.  Because bacterial vaginosis increases your risk for STIs (sexually transmitted infections), getting treated can help reduce your risk for chlamydia, gonorrhea, herpes, and HIV (human immunodeficiency virus). Treatment is also important for preventing complications in pregnant women, because the condition can cause an early (premature) delivery.  This condition is treated with antibiotic medicines. These may be given as a pill, a vaginal cream, or a medicine that is put into the vagina (suppository). This information is not intended to replace advice given to you by your health care provider. Make sure you discuss any questions you have with your health care provider. Document Released: 04/07/2005 Document Revised: 03/20/2017 Document Reviewed: 12/22/2015 Elsevier Patient Education  2020 Elsevier Inc.  

## 2019-03-29 LAB — NUSWAB VAGINITIS PLUS (VG+)
BVAB 2: HIGH Score — AB
Candida albicans, NAA: NEGATIVE
Candida glabrata, NAA: NEGATIVE
Chlamydia trachomatis, NAA: NEGATIVE
Megasphaera 1: HIGH Score — AB
Neisseria gonorrhoeae, NAA: NEGATIVE
Trich vag by NAA: NEGATIVE

## 2019-06-16 ENCOUNTER — Telehealth: Payer: Self-pay

## 2019-06-16 ENCOUNTER — Other Ambulatory Visit: Payer: Self-pay | Admitting: Family Medicine

## 2019-06-16 DIAGNOSIS — Z3041 Encounter for surveillance of contraceptive pills: Secondary | ICD-10-CM

## 2019-06-16 MED ORDER — DROSPIRENONE-ETHINYL ESTRADIOL 3-0.02 MG PO TABS: 1.0000 | ORAL_TABLET | Freq: Every day | ORAL | 1 refills | Status: DC

## 2019-06-16 NOTE — Telephone Encounter (Signed)
Pt calling, Yaz isn't covered by ins.  Can the generic be called in?  [phone]  Adv pt to call ins to see what they will cover and call us back.  Pt states they covered drospirenone-ethinyl estradiol before and wants that sent in.  Rx was sent last year to [facility].  [facility].  Pharm changed and rx eRx'd.

## 2019-07-21 ENCOUNTER — Ambulatory Visit (INDEPENDENT_AMBULATORY_CARE_PROVIDER_SITE_OTHER): Payer: No Typology Code available for payment source | Admitting: Obstetrics and Gynecology

## 2019-07-21 ENCOUNTER — Other Ambulatory Visit: Payer: Self-pay

## 2019-07-21 ENCOUNTER — Other Ambulatory Visit (HOSPITAL_COMMUNITY)
Admission: RE | Admit: 2019-07-21 | Discharge: 2019-07-21 | Disposition: A | Discharge status: Home / Self Care | Payer: No Typology Code available for payment source | Source: Ambulatory Visit | Attending: Obstetrics and Gynecology | Admitting: Obstetrics and Gynecology

## 2019-07-21 ENCOUNTER — Encounter: Payer: Self-pay | Admitting: Obstetrics and Gynecology

## 2019-07-21 VITALS — BP 90/64 | Ht 66.0 in | Wt 134.0 lb

## 2019-07-21 DIAGNOSIS — Z3202 Encounter for pregnancy test, result negative: Secondary | ICD-10-CM

## 2019-07-21 DIAGNOSIS — N938 Other specified abnormal uterine and vaginal bleeding: Secondary | ICD-10-CM

## 2019-07-21 DIAGNOSIS — N921 Excessive and frequent menstruation with irregular cycle: Secondary | ICD-10-CM | POA: Diagnosis not present

## 2019-07-21 DIAGNOSIS — Z113 Encounter for screening for infections with a predominantly sexual mode of transmission: Secondary | ICD-10-CM | POA: Diagnosis not present

## 2019-07-21 LAB — POCT URINE PREGNANCY: Preg Test, Ur: NEGATIVE

## 2019-07-21 NOTE — Progress Notes (Signed)
Janet Patch, MD   Chief Complaint  Patient presents with  . Vaginal Bleeding    spotting in between this and last cycle (lasted two weeks), breast tenderness, no abnormal pain    HPI:      [name] is a 24 y.o. G1P1001 who LMP was Patient's last menstrual period was 07/16/2019 (exact date)., presents today for BTB on OCPs this past cycle. Bleeding was very light and more like pinkish d/c for 2 wks. Also with bilat breast tenderness.Then had normal  menses and now on new pack of pills.  Menses usually monthly, last 5-6 days, mild dysmen, no BTB. Has been on OCPs for a long time. No missed/late pills. No pelvic pain. She is sex active, no new partners. Neg STD testing 12/20 Last annual 7/20 .  Past Medical History:  Diagnosis Date  . Hx of chest pain   . Left ankle pain 11/21/2017  . Uses birth control     History reviewed. No pertinent surgical history.  History reviewed. No pertinent family history.  Social History   Socioeconomic History  . Marital status: Single    Spouse name: Not on file  . Number of children: Not on file  . Years of education: Not on file  . Highest education level: Not on file  Occupational History  . Not on file  Tobacco Use  . Smoking status: Never Smoker  . Smokeless tobacco: Never Used  Substance and Sexual Activity  . Alcohol use: Yes    Alcohol/week: 3.0 standard drinks    Types: 3 Standard drinks or equivalent per week  . Drug use: Never  . Sexual activity: Yes    Birth control/protection: Pill  Other Topics Concern  . Not on file  Social History Narrative  . Not on file   Social Determinants of Health   Financial Resource Strain:   . Difficulty of Paying Living Expenses:   Food Insecurity:   . Worried About Charity fundraiser in the Last Year:   . Arboriculturist in the Last Year:   Transportation Needs:   . Film/video editor (Medical):   Marland Kitchen Lack of Transportation (Non-Medical):   Physical  Activity:   . Days of Exercise per Week:   . Minutes of Exercise per Session:   Stress:   . Feeling of Stress :   Social Connections:   . Frequency of Communication with Friends and Family:   . Frequency of Social Gatherings with Friends and Family:   . Attends Religious Services:   . Active Member of Clubs or Organizations:   . Attends Archivist Meetings:   Marland Kitchen Marital Status:   Intimate Partner Violence:   . Fear of Current or Ex-Partner:   . Emotionally Abused:   Marland Kitchen Physically Abused:   . Sexually Abused:     Outpatient Medications Prior to Visit  Medication Sig Dispense Refill  . drospirenone-ethinyl estradiol (YAZ) 3-0.02 MG tablet Take 1 tablet by mouth daily. 3 Package 1   No facility-administered medications prior to visit.      ROS:  Review of Systems  Constitutional: Negative for fever.  Gastrointestinal: Negative for blood in stool, constipation, diarrhea, nausea and vomiting.  Genitourinary: Positive for menstrual problem. Negative for dyspareunia, dysuria, flank pain, frequency, hematuria, urgency, vaginal bleeding, vaginal discharge and vaginal pain.  Musculoskeletal: Negative for back pain.  Skin: Negative for rash.  BREAST: No symptoms   OBJECTIVE:   Vitals:  BP 90/64   Ht 5\' 6"  (1.676 m)   Wt 134 lb (60.8 kg)   LMP 07/16/2019 (Exact Date)   BMI 21.63 kg/m   Physical Exam Vitals reviewed.  Constitutional:      Appearance: She is well-developed.  Pulmonary:     Effort: Pulmonary effort is normal.  Genitourinary:    General: Normal vulva.     Pubic Area: No rash.      Labia:        Right: No rash, tenderness or lesion.        Left: No rash, tenderness or lesion.      Vagina: Normal. No vaginal discharge, erythema or tenderness.     Cervix: Normal.     Uterus: Normal. Not enlarged and not tender.      Adnexa: Right adnexa normal and left adnexa normal.       Right: No mass or tenderness.         Left: No mass or tenderness.     Musculoskeletal:        General: Normal range of motion.     Cervical back: Normal range of motion.  Skin:    General: Skin is warm and dry.  Neurological:     General: No focal deficit present.     Mental Status: She is alert and oriented to person, place, and time.  Psychiatric:        Mood and Affect: Mood normal.        Behavior: Behavior normal.        Thought Content: Thought content normal.        Judgment: Judgment normal.     Results: Results for orders placed or performed in visit on 07/21/19 (from the past 24 hour(s))  POCT urine pregnancy     Status: Normal   Collection Time: 07/21/19  3:15 PM  Result Value Ref Range   Preg Test, Ur Negative Negative     Assessment/Plan: Breakthrough bleeding on OCPs - Plan: POCT urine pregnancy; For 1 cycle only; neg UPT. Check STDs. If neg, reassurance and follow cycles.. If sx persist, will change OCPs. Otherwise, most likely due to generic pills.   Screening for STD (sexually transmitted disease) - Plan: POCT urine pregnancy, Cervicovaginal ancillary only    Return if symptoms worsen or fail to improve.  Markala Sitts B. Ahijah Devery, PA-C 07/21/2019 4:25 PM

## 2019-07-21 NOTE — Patient Instructions (Signed)
I value your feedback and entrusting us with your care. If you get a [facility] patient survey, I would appreciate you taking the time to let us know about your experience today. Thank you!  As of March 31, 2019, your lab results will be released to your MyChart immediately, before I even have a chance to see them. Please give me time to review them and contact you if there are any abnormalities. Thank you for your patience.  

## 2019-07-25 LAB — CERVICOVAGINAL ANCILLARY ONLY
Chlamydia: NEGATIVE
Comment: NEGATIVE
Comment: NORMAL
Neisseria Gonorrhea: NEGATIVE

## 2019-12-20 ENCOUNTER — Other Ambulatory Visit: Payer: Self-pay | Admitting: Advanced Practice Midwife

## 2019-12-20 DIAGNOSIS — Z3041 Encounter for surveillance of contraceptive pills: Secondary | ICD-10-CM

## 2019-12-20 NOTE — Telephone Encounter (Signed)
Pt saw Copland on 07/21/19

## 2019-12-31 ENCOUNTER — Other Ambulatory Visit: Payer: Self-pay | Admitting: Advanced Practice Midwife

## 2019-12-31 DIAGNOSIS — Z3041 Encounter for surveillance of contraceptive pills: Secondary | ICD-10-CM

## 2020-01-02 NOTE — Telephone Encounter (Signed)
Patient is overdue for annual exam. No additional refills prior to annual.

## 2020-01-02 NOTE — Telephone Encounter (Signed)
Advise

## 2020-02-21 ENCOUNTER — Other Ambulatory Visit: Payer: Self-pay | Admitting: Advanced Practice Midwife

## 2020-02-21 DIAGNOSIS — Z3041 Encounter for surveillance of contraceptive pills: Secondary | ICD-10-CM

## 2020-04-22 ENCOUNTER — Other Ambulatory Visit: Payer: Self-pay | Admitting: Advanced Practice Midwife

## 2020-04-22 DIAGNOSIS — Z3041 Encounter for surveillance of contraceptive pills: Secondary | ICD-10-CM

## 2020-04-25 ENCOUNTER — Other Ambulatory Visit: Payer: Self-pay | Admitting: Advanced Practice Midwife

## 2020-04-25 DIAGNOSIS — Z3041 Encounter for surveillance of contraceptive pills: Secondary | ICD-10-CM

## 2020-04-26 NOTE — Telephone Encounter (Signed)
LMVM to notify request denied. Patient past due for AE (Last 10/2018) last seen 07/2019. Advised to contact office to schedule apt.

## 2020-04-26 NOTE — Telephone Encounter (Signed)
Nikki refill request. [phone]

## 2020-05-10 ENCOUNTER — Other Ambulatory Visit: Payer: Self-pay

## 2020-05-10 ENCOUNTER — Ambulatory Visit (INDEPENDENT_AMBULATORY_CARE_PROVIDER_SITE_OTHER): Payer: No Typology Code available for payment source | Admitting: Advanced Practice Midwife

## 2020-05-10 ENCOUNTER — Encounter: Payer: Self-pay | Admitting: Advanced Practice Midwife

## 2020-05-10 ENCOUNTER — Other Ambulatory Visit (HOSPITAL_COMMUNITY)
Admission: RE | Admit: 2020-05-10 | Discharge: 2020-05-10 | Disposition: A | Discharge status: Home / Self Care | Payer: No Typology Code available for payment source | Source: Ambulatory Visit | Attending: Advanced Practice Midwife | Admitting: Advanced Practice Midwife

## 2020-05-10 VITALS — BP 110/60 | Ht 66.0 in | Wt 143.0 lb

## 2020-05-10 DIAGNOSIS — Z124 Encounter for screening for malignant neoplasm of cervix: Secondary | ICD-10-CM | POA: Diagnosis not present

## 2020-05-10 DIAGNOSIS — Z01419 Encounter for gynecological examination (general) (routine) without abnormal findings: Secondary | ICD-10-CM

## 2020-05-10 DIAGNOSIS — Z8742 Personal history of other diseases of the female genital tract: Secondary | ICD-10-CM

## 2020-05-10 DIAGNOSIS — R300 Dysuria: Secondary | ICD-10-CM | POA: Diagnosis not present

## 2020-05-10 DIAGNOSIS — Z3041 Encounter for surveillance of contraceptive pills: Secondary | ICD-10-CM

## 2020-05-10 MED ORDER — DROSPIRENONE-ETHINYL ESTRADIOL 3-0.02 MG PO TABS: 1.0000 | ORAL_TABLET | Freq: Every day | ORAL | 3 refills | Status: DC

## 2020-05-10 NOTE — Progress Notes (Signed)
Gynecology Annual Exam  PCP: Duanne Limerick, MD  Chief Complaint:  Chief Complaint  Patient presents with  . Annual Exam    History of Present Illness: Patient is a 25 y.o. G1P1001 presents for annual exam. The patient has complaint today of UTI symptoms that began in the past week. She had 2 days of burning and frequency followed by a couple of days without symptoms. The burning symptom returned in the past 2 days. She reports 2 UTIs in the past year. We discussed repeat PAP today due to ASCUS PAP in 2020.   LMP: Patient's last menstrual period was 04/23/2020. Menarche:12 Average Interval: regular, 28 days Duration of flow: 4 days Heavy Menses: no Clots: no Intermenstrual Bleeding: no Postcoital Bleeding: no Dysmenorrhea: no  The patient is sexually active. She currently uses OCP (estrogen/progesterone) for contraception. She denies dyspareunia.  The patient does perform self breast exams.  There is no notable family history of breast or ovarian cancer in her family.  The patient wears seatbelts: yes.  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    The patient denies current symptoms of depression.    Review of Systems: ROS  Past Medical History:  Patient Active Problem List   Diagnosis Date Noted  . Acute chest pain 12/27/2013  . Anxiety 12/27/2013  . Health care maintenance 10/03/2011    Past Surgical History:  History reviewed. No pertinent surgical history.  Gynecologic History:  Patient's last menstrual period was 04/23/2020. Contraception: OCP (estrogen/progesterone) Last Pap: 2 years ago Results were:  ASCUS with NEGATIVE high risk HPV   Obstetric History: G1P1001  Family History:  History reviewed. No pertinent family history.  Social History:  Social History   Socioeconomic History  . Marital status: Single    Spouse name: Not on file  . Number of  children: Not on file  . Years of education: Not on file  . Highest education level: Not on file  Occupational History  . Not on file  Tobacco Use  . Smoking status: Never Smoker  . Smokeless tobacco: Never Used  Vaping Use  . Vaping Use: Never used  Substance and Sexual Activity  . Alcohol use: Yes    Alcohol/week: 3.0 standard drinks    Types: 3 Standard drinks or equivalent per week  . Drug use: Never  . Sexual activity: Yes    Birth control/protection: Pill  Other Topics Concern  . Not on file  Social History Narrative  . Not on file   Social Determinants of Health   Financial Resource Strain: Not on file  Food Insecurity: Not on file  Transportation Needs: Not on file  Physical Activity: Not on file  Stress: Not on file  Social Connections: Not on file  Intimate Partner Violence: Not on file    Allergies:  No Known Allergies  Medications: Prior to Admission medications   Medication Sig Start Date End Date Taking? Authorizing Provider  drospirenone-ethinyl estradiol (NIKKI) 3-0.02 MG tablet Take 1 tablet by mouth daily. 05/10/20   Tresea Mall, CNM    Physical Exam Vitals: Blood pressure 110/60, height 5\' 6"  (1.676 m), weight 143 lb (64.9 kg), last menstrual period 04/23/2020.  General: NAD HEENT: normocephalic, anicteric Thyroid: no enlargement, no palpable nodules Pulmonary: No increased work of breathing, CTAB Cardiovascular: RRR, distal pulses 2+ Breast: Breast symmetrical, no tenderness, no palpable nodules or  masses, no skin or nipple retraction present, no nipple discharge.  No axillary or supraclavicular lymphadenopathy. Abdomen: NABS, soft, non-tender, non-distended.  Umbilicus without lesions.  No hepatomegaly, splenomegaly or masses palpable. No evidence of hernia  Genitourinary:  External: Normal external female genitalia.  Normal urethral meatus, normal Bartholin's and Skene's glands.    Vagina: Normal vaginal mucosa, no evidence of prolapse.     Cervix: Grossly normal in appearance, no bleeding  Uterus: Non-enlarged, mobile, normal contour.  No CMT  Adnexa: ovaries non-enlarged, no adnexal masses  Rectal: deferred  Lymphatic: no evidence of inguinal lymphadenopathy Extremities: no edema, erythema, or tenderness Neurologic: Grossly intact Psychiatric: mood appropriate, affect full  Female chaperone present for pelvic and breast  portions of the physical exam    Assessment: 25 y.o. G1P1001 routine annual exam  Plan: Problem List Items Addressed This Visit   None   Visit Diagnoses    Well woman exam with routine gynecological exam    -  Primary   Relevant Orders   Cytology - PAP   Screening for cervical cancer       Relevant Orders   Cytology - PAP   Hx of abnormal cervical Pap smear       Relevant Orders   Cytology - PAP   Dysuria       Relevant Orders   Urine Culture   Encounter for surveillance of contraceptive pills       Relevant Medications   drospirenone-ethinyl estradiol (NIKKI) 3-0.02 MG tablet      1) 4) Gardasil Series discussed and if applicable offered to patient - Patient has not previously completed 3 shot series   2) STI screening  was offered and declined  3)  ASCCP guidelines and rationale discussed.  Patient opts for every 3 years screening interval  4) Contraception - the patient is currently using  OCP (estrogen/progesterone).  She is happy with her current form of contraception and plans to continue We discussed safe sex practices to reduce her furture risk of STI's.    5) Return in about 1 year (around 05/10/2021) for annual established gyn.   Tresea Mall, CNM [facility] 05/10/2020, 4:16 PM

## 2020-05-11 ENCOUNTER — Other Ambulatory Visit: Payer: Self-pay

## 2020-05-14 ENCOUNTER — Other Ambulatory Visit: Payer: Self-pay | Admitting: Advanced Practice Midwife

## 2020-05-14 DIAGNOSIS — N3000 Acute cystitis without hematuria: Secondary | ICD-10-CM

## 2020-05-14 LAB — URINE CULTURE

## 2020-05-14 MED ORDER — SULFAMETHOXAZOLE-TRIMETHOPRIM 800-160 MG PO TABS: 1.0000 | ORAL_TABLET | Freq: Two times a day (BID) | ORAL | 0 refills | Status: AC

## 2020-05-14 NOTE — Progress Notes (Signed)
Rx Bactrim sent to treat UTI staph sapropyticus.

## 2020-05-15 LAB — CYTOLOGY - PAP: Diagnosis: NEGATIVE

## 2020-06-27 ENCOUNTER — Encounter: Payer: No Typology Code available for payment source | Admitting: Family Medicine

## 2020-06-29 ENCOUNTER — Other Ambulatory Visit: Payer: Self-pay

## 2020-06-29 ENCOUNTER — Ambulatory Visit (INDEPENDENT_AMBULATORY_CARE_PROVIDER_SITE_OTHER): Payer: No Typology Code available for payment source

## 2020-06-29 ENCOUNTER — Ambulatory Visit: Payer: Self-pay

## 2020-06-29 ENCOUNTER — Ambulatory Visit
Admission: EM | Admit: 2020-06-29 | Discharge: 2020-06-29 | Disposition: A | Discharge status: Home / Self Care | Payer: No Typology Code available for payment source | Attending: Family Medicine | Admitting: Family Medicine

## 2020-06-29 ENCOUNTER — Encounter: Payer: Self-pay | Admitting: Emergency Medicine

## 2020-06-29 DIAGNOSIS — R1011 Right upper quadrant pain: Secondary | ICD-10-CM

## 2020-06-29 LAB — CBC WITH DIFFERENTIAL/PLATELET
Abs Immature Granulocytes: 0.01 10*3/uL (ref 0.00–0.07)
Basophils Absolute: 0 10*3/uL (ref 0.0–0.1)
Basophils Relative: 1 %
Eosinophils Absolute: 0.1 10*3/uL (ref 0.0–0.5)
Eosinophils Relative: 3 %
HCT: 40.2 % (ref 36.0–46.0)
Hemoglobin: 13.8 g/dL (ref 12.0–15.0)
Immature Granulocytes: 0 %
Lymphocytes Relative: 20 %
Lymphs Abs: 0.8 10*3/uL (ref 0.7–4.0)
MCH: 30.7 pg (ref 26.0–34.0)
MCHC: 34.3 g/dL (ref 30.0–36.0)
MCV: 89.3 fL (ref 80.0–100.0)
Monocytes Absolute: 0.4 10*3/uL (ref 0.1–1.0)
Monocytes Relative: 9 %
Neutro Abs: 2.7 10*3/uL (ref 1.7–7.7)
Neutrophils Relative %: 67 %
Platelets: 217 10*3/uL (ref 150–400)
RBC: 4.5 MIL/uL (ref 3.87–5.11)
RDW: 12 % (ref 11.5–15.5)
WBC: 4.1 10*3/uL (ref 4.0–10.5)
nRBC: 0 % (ref 0.0–0.2)

## 2020-06-29 LAB — COMPREHENSIVE METABOLIC PANEL
ALT: 15 U/L (ref 0–44)
AST: 16 U/L (ref 15–41)
Albumin: 4.1 g/dL (ref 3.5–5.0)
Alkaline Phosphatase: 38 U/L (ref 38–126)
Anion gap: 14 (ref 5–15)
BUN: 12 mg/dL (ref 6–20)
CO2: 21 mmol/L — ABNORMAL LOW (ref 22–32)
Calcium: 9.4 mg/dL (ref 8.9–10.3)
Chloride: 100 mmol/L (ref 98–111)
Creatinine, Ser: 0.94 mg/dL (ref 0.44–1.00)
GFR, Estimated: >60 mL/min (ref >60)
Glucose, Bld: 103 mg/dL — ABNORMAL HIGH (ref 70–99)
Potassium: 4 mmol/L (ref 3.5–5.1)
Sodium: 135 mmol/L (ref 135–145)
Total Bilirubin: 0.9 mg/dL (ref 0.3–1.2)
Total Protein: 7.3 g/dL (ref 6.5–8.1)

## 2020-06-29 LAB — LIPASE, BLOOD: Lipase: 25 U/L (ref 11–51)

## 2020-06-29 MED ORDER — PANTOPRAZOLE SODIUM 40 MG PO TBEC: 40.0000 mg | DELAYED_RELEASE_TABLET | Freq: Every day | ORAL | 0 refills | Status: DC

## 2020-06-29 MED ORDER — ONDANSETRON HCL 4 MG PO TABS: 4.0000 mg | ORAL_TABLET | Freq: Three times a day (TID) | ORAL | 0 refills | Status: DC | PRN

## 2020-06-29 NOTE — Discharge Instructions (Addendum)
Medication as prescribed.  Follow up with your PCP. If persists, may need HIDA scan.  Take care  Dr. Adriana Simas

## 2020-06-29 NOTE — ED Provider Notes (Signed)
[facility]    CSN: [id number] Arrival date & time: 06/29/20  1137  History   Chief Complaint Chief Complaint  Patient presents with  . Abdominal Pain   HPI  25 year old female presents with abdominal pain.  Patient reports that this started on Wed. She reports RUQ pain. Associated nausea and diarrhea. No fever. Pain 6/10 in severity.  She reports decrease in appetite.  She has not eaten anything since Wednesday to Thursday.  No fever.  She is tolerating fluids.  No relieving factors.  No other complaints.  Past Medical History:  Diagnosis Date  . Hx of chest pain   . Left ankle pain 11/21/2017  . Uses birth control     Patient Active Problem List   Diagnosis Date Noted  . Acute chest pain 12/27/2013  . Anxiety 12/27/2013  . Health care maintenance 10/03/2011   OB History    Gravida  1   Para  1   Term  1   Preterm      AB      Living  1     SAB      IAB      Ectopic      Multiple      Live Births  1            Home Medications    Prior to Admission medications   Medication Sig Start Date End Date Taking? Authorizing Provider  ondansetron (ZOFRAN) 4 MG tablet Take 1 tablet (4 mg total) by mouth every 8 (eight) hours as needed for nausea or vomiting. 06/29/20  Yes Desyre Calma G, DO  pantoprazole (PROTONIX) 40 MG tablet Take 1 tablet (40 mg total) by mouth daily. 06/29/20  Yes Tanvir Hipple G, DO  drospirenone-ethinyl estradiol (NIKKI) 3-0.02 MG tablet Take 1 tablet by mouth daily. 05/10/20   Tresea Mall, CNM   Social History Social History   Tobacco Use  . Smoking status: Never Smoker  . Smokeless tobacco: Never Used  Vaping Use  . Vaping Use: Never used  Substance Use Topics  . Alcohol use: Yes    Alcohol/week: 3.0 standard drinks    Types: 3 Standard drinks or equivalent per week  . Drug use: Never     Allergies   Patient has no known allergies.   Review of Systems Review of Systems  Constitutional: Positive for  appetite change.  Gastrointestinal: Positive for abdominal pain, diarrhea and nausea.   Physical Exam Triage Vital Signs ED Triage Vitals  Enc Vitals Group     BP 06/29/20 1153 125/73     Pulse Rate 06/29/20 1153 93     Resp 06/29/20 1153 18     Temp 06/29/20 1153 98.3 F (36.8 C)     Temp Source 06/29/20 1153 Oral     SpO2 06/29/20 1153 100 %     Weight --      Height --      Head Circumference --      Peak Flow --      Pain Score 06/29/20 1152 6     Pain Loc --      Pain Edu? --      Excl. in GC? --    Updated Vital Signs BP 125/73 (BP Location: Left Arm)   Pulse 93   Temp 98.3 F (36.8 C) (Oral)   Resp 18   LMP 06/01/2020   SpO2 100%   Visual Acuity Right Eye Distance:   Left Eye  Distance:   Bilateral Distance:    Right Eye Near:   Left Eye Near:    Bilateral Near:     Physical Exam Vitals and nursing note reviewed.  Constitutional:      General: She is not in acute distress.    Appearance: Normal appearance. She is not ill-appearing.  HENT:     Head: Normocephalic and atraumatic.  Eyes:     General:        Right eye: No discharge.        Left eye: No discharge.     Conjunctiva/sclera: Conjunctivae normal.  Cardiovascular:     Rate and Rhythm: Normal rate and regular rhythm.     Heart sounds: No murmur heard.   Pulmonary:     Effort: Pulmonary effort is normal.     Breath sounds: Normal breath sounds. No wheezing, rhonchi or rales.  Abdominal:     General: There is no distension.     Palpations: Abdomen is soft.     Comments: Tenderness to palpation in the right upper quadrant as well as the epigastric region.  Neurological:     Mental Status: She is alert.    UC Treatments / Results  Labs (all labs ordered are listed, but only abnormal results are displayed) Labs Reviewed  COMPREHENSIVE METABOLIC PANEL - Abnormal; Notable for the following components:      Result Value   CO2 21 (*)    Glucose, Bld 103 (*)    All other components within  normal limits  CBC WITH DIFFERENTIAL/PLATELET  LIPASE, BLOOD    EKG   Radiology US Abdomen Limited RUQ (LIVER/GB)  Result Date: 06/29/2020 CLINICAL DATA:  Right upper quadrant abdominal pain EXAM: ULTRASOUND ABDOMEN LIMITED RIGHT UPPER QUADRANT COMPARISON:  None. FINDINGS: Gallbladder: No gallstones or wall thickening visualized. No sonographic Murphy sign noted by sonographer. Common bile duct: Diameter: 2 mm. Liver: No focal lesion identified. Within normal limits in parenchymal echogenicity. Portal vein is patent on color Doppler imaging with normal direction of blood flow towards the liver. Other: None. IMPRESSION: Normal right upper quadrant ultrasound. Electronically Signed   By: Duanne Guess D.O.   On: 06/29/2020 13:57    Procedures Procedures (including critical care time)  Medications Ordered in UC Medications - No data to display  Initial Impression / Assessment and Plan / UC Course  I have reviewed the triage vital signs and the nursing notes.  Pertinent labs & imaging results that were available during my care of the patient were reviewed by me and considered in my medical decision making (see chart for details).    25 year old female presents with right upper quadrant pain.  Laboratory studies unremarkable.  Right upper quadrant ultrasound was normal.  Placing on Protonix and Zofran.  Advised to try and resume her normal diet.  Advised to avoid fatty foods.  May need HIDA scan.  Advised to follow-up with PCP.  Final Clinical Impressions(s) / UC Diagnoses   Final diagnoses:  RUQ pain     Discharge Instructions     Medication as prescribed.  Follow up with your PCP. If persists, may need HIDA scan.  Take care  Dr. Adriana Simas    ED Prescriptions    Medication Sig Dispense Auth. Provider   pantoprazole (PROTONIX) 40 MG tablet Take 1 tablet (40 mg total) by mouth daily. 30 tablet Bryli Mantey G, DO   ondansetron (ZOFRAN) 4 MG tablet Take 1 tablet (4 mg total) by  mouth every 8 (eight) hours  as needed for nausea or vomiting. 20 tablet Tommie Sams, DO     PDMP not reviewed this encounter.   Tommie Sams, [place] 06/29/20 [phone]

## 2020-06-29 NOTE — ED Triage Notes (Addendum)
Pt states that two days ago she noticed upper right quadrant abdominal pain. Pt states that pain intensifies when she press down on her side. Pt states she thinks it may be her gall bladder. Pt states that she also nausea and has not been able to eat anything the last few days

## 2020-07-02 ENCOUNTER — Ambulatory Visit: Payer: No Typology Code available for payment source | Admitting: Family Medicine

## 2020-07-21 ENCOUNTER — Other Ambulatory Visit: Payer: Self-pay | Admitting: Family Medicine

## 2021-04-23 ENCOUNTER — Other Ambulatory Visit: Payer: Self-pay | Admitting: Advanced Practice Midwife

## 2021-04-23 DIAGNOSIS — Z3041 Encounter for surveillance of contraceptive pills: Secondary | ICD-10-CM

## 2021-05-13 ENCOUNTER — Other Ambulatory Visit: Payer: Self-pay | Admitting: Advanced Practice Midwife

## 2021-05-13 DIAGNOSIS — Z3041 Encounter for surveillance of contraceptive pills: Secondary | ICD-10-CM

## 2021-05-24 ENCOUNTER — Other Ambulatory Visit: Payer: Self-pay | Admitting: Advanced Practice Midwife

## 2021-05-24 DIAGNOSIS — Z3041 Encounter for surveillance of contraceptive pills: Secondary | ICD-10-CM

## 2021-05-27 NOTE — Progress Notes (Signed)
PCP:  [name], MD   Chief Complaint  Patient presents with   Gynecologic Exam    Sore spot on LB     HPI:      [name] is a 26 y.o. G1P1001 whose LMP was Patient's last menstrual period was 05/24/2021 (exact date)., presents today for her annual examination.  Her menses are regular every 28-30 days, lasting 3-4 days.  Dysmenorrhea mild. She does have infrequent intermenstrual bleeding without late/missed pills.  Sex activity: single partner, contraception - OCP (estrogen/progesterone).  Last Pap: 05/10/20 Results were: no abnormalities  Hx of STDs: none  There is no FH of breast cancer. There is no FH of ovarian cancer. The patient does do self-breast exams. Has noticed tender area LT breast for several days; no masses/trauma. Drinks minimal caffeine. Just had period, due to restart OCPs.   Tobacco use: The patient denies current or previous tobacco use. Alcohol use: social drinker No drug use.  Exercise: moderately active  She does get adequate calcium but not Vitamin D in her diet.  Patient Active Problem List   Diagnosis Date Noted   Acute chest pain 12/27/2013   Anxiety 12/27/2013   Health care maintenance 10/03/2011    Past Surgical History:  Procedure Laterality Date   NO PAST SURGERIES      History reviewed. No pertinent family history.  Social History   Socioeconomic History   Marital status: Single    Spouse name: Not on file   Number of children: Not on file   Years of education: Not on file   Highest education level: Not on file  Occupational History   Not on file  Tobacco Use   Smoking status: Never   Smokeless tobacco: Never  Vaping Use   Vaping Use: Never used  Substance and Sexual Activity   Alcohol use: Yes    Alcohol/week: 3.0 standard drinks    Types: 3 Standard drinks or equivalent per week   Drug use: Never   Sexual activity: Yes    Birth control/protection: Pill  Other Topics Concern   Not on file  Social  History Narrative   Not on file   Social Determinants of Health   Financial Resource Strain: Not on file  Food Insecurity: Not on file  Transportation Needs: Not on file  Physical Activity: Not on file  Stress: Not on file  Social Connections: Not on file  Intimate Partner Violence: Not on file     Current Outpatient Medications:    drospirenone-ethinyl estradiol (NIKKI) 3-0.02 MG tablet, Take 1 tablet by mouth daily., Disp: 84 tablet, Rfl: 3     ROS:  Review of Systems  Constitutional:  Negative for fatigue, fever and unexpected weight change.  Respiratory:  Negative for cough, shortness of breath and wheezing.   Cardiovascular:  Negative for chest pain, palpitations and leg swelling.  Gastrointestinal:  Negative for blood in stool, constipation, diarrhea, nausea and vomiting.  Endocrine: Negative for cold intolerance, heat intolerance and polyuria.  Genitourinary:  Negative for dyspareunia, dysuria, flank pain, frequency, genital sores, hematuria, menstrual problem, pelvic pain, urgency, vaginal bleeding, vaginal discharge and vaginal pain.  Musculoskeletal:  Negative for back pain, joint swelling and myalgias.  Skin:  Negative for rash.  Neurological:  Negative for dizziness, syncope, light-headedness, numbness and headaches.  Hematological:  Negative for adenopathy.  Psychiatric/Behavioral:  Negative for agitation, confusion, sleep disturbance and suicidal ideas. The patient is not nervous/anxious.   BREAST: tenderness   Objective:  BP 126/70    Ht 5\' 6"  (1.676 m)    Wt 150 lb (68 kg)    LMP 05/24/2021 (Exact Date)    BMI 24.21 kg/m    Physical Exam Constitutional:      Appearance: She is well-developed.  Genitourinary:     Vulva normal.     Right Labia: No rash, tenderness or lesions.    Left Labia: No tenderness, lesions or rash.    No vaginal discharge, erythema or tenderness.      Right Adnexa: not tender and no mass present.    Left Adnexa: not tender and  no mass present.    No cervical friability or polyp.     Uterus is not enlarged or tender.  Breasts:    Right: No mass, nipple discharge, skin change or tenderness.     Left: No mass, nipple discharge, skin change or tenderness.  Neck:     Thyroid: No thyromegaly.  Cardiovascular:     Rate and Rhythm: Normal rate and regular rhythm.     Heart sounds: Normal heart sounds. No murmur heard. Pulmonary:     Effort: Pulmonary effort is normal.     Breath sounds: Normal breath sounds.  Abdominal:     Palpations: Abdomen is soft.     Tenderness: There is no abdominal tenderness. There is no guarding or rebound.  Musculoskeletal:        General: Normal range of motion.     Cervical back: Normal range of motion.  Lymphadenopathy:     Cervical: No cervical adenopathy.  Neurological:     General: No focal deficit present.     Mental Status: She is alert and oriented to person, place, and time.     Cranial Nerves: No cranial nerve deficit.  Skin:    General: Skin is warm and dry.  Psychiatric:        Mood and Affect: Mood normal.        Behavior: Behavior normal.        Thought Content: Thought content normal.        Judgment: Judgment normal.  Vitals reviewed.    Assessment/Plan: Encounter for annual routine gynecological examination  Encounter for surveillance of contraceptive pills - Plan: drospirenone-ethinyl estradiol (NIKKI) 3-0.02 MG tablet; OCP RF. Has occas BTB for a couple yrs, no late/missed pills. Can change OCPs vs follow expectantly.   Breast tenderness--for a few days, neg exam except tenderness. Just finished menses. See if sx improve after OCP restart. If persist, will check breast u/s.  Meds ordered this encounter  Medications   drospirenone-ethinyl estradiol (NIKKI) 3-0.02 MG tablet    Sig: Take 1 tablet by mouth daily.    Dispense:  84 tablet    Refill:  3    Order Specific Question:   Supervising Provider    Answer:   Gae Dry [id number]              GYN counsel adequate intake of calcium and vitamin D, diet and exercise     F/U  Return in about 1 year (around 05/28/2022).  Vidya Bamford B. Kieron Kantner, PA-C 05/28/2021 8:39 AM

## 2021-05-28 ENCOUNTER — Ambulatory Visit (INDEPENDENT_AMBULATORY_CARE_PROVIDER_SITE_OTHER): Payer: No Typology Code available for payment source | Admitting: Obstetrics and Gynecology

## 2021-05-28 ENCOUNTER — Encounter: Payer: Self-pay | Admitting: Obstetrics and Gynecology

## 2021-05-28 ENCOUNTER — Other Ambulatory Visit: Payer: Self-pay

## 2021-05-28 VITALS — BP 126/70 | Ht 66.0 in | Wt 150.0 lb

## 2021-05-28 DIAGNOSIS — Z01419 Encounter for gynecological examination (general) (routine) without abnormal findings: Secondary | ICD-10-CM

## 2021-05-28 DIAGNOSIS — N644 Mastodynia: Secondary | ICD-10-CM

## 2021-05-28 DIAGNOSIS — Z3041 Encounter for surveillance of contraceptive pills: Secondary | ICD-10-CM

## 2021-05-28 MED ORDER — DROSPIRENONE-ETHINYL ESTRADIOL 3-0.02 MG PO TABS: 1.0000 | ORAL_TABLET | Freq: Every day | ORAL | 3 refills | Status: DC

## 2022-01-30 IMAGING — US US ABDOMEN LIMITED RUQ/ASCITES
1 series · 14 of 25 positions shown · non-contrast
Comparison: None.

CLINICAL DATA: Right upper quadrant abdominal pain

EXAM:
ULTRASOUND ABDOMEN LIMITED RIGHT UPPER QUADRANT

[Series 1: us abdomen limited ruq/ascites · 0.20mm/px · 14 of 40 slices shown]
[im 1/40]
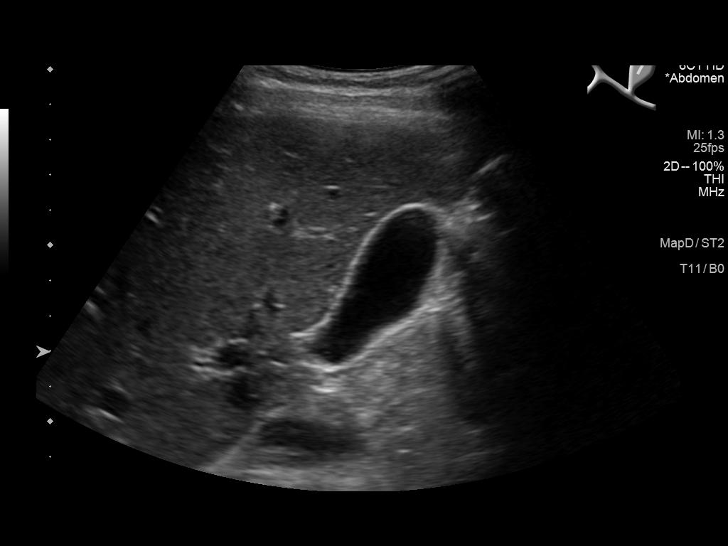
[im 4/40]
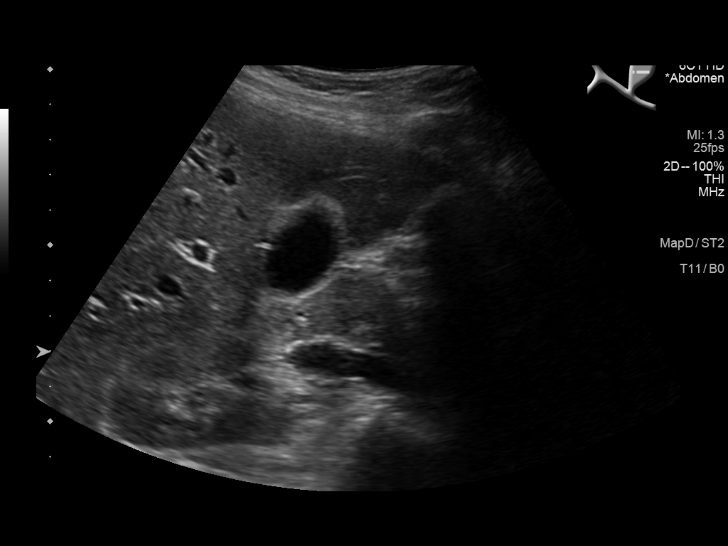
[im 7/40]
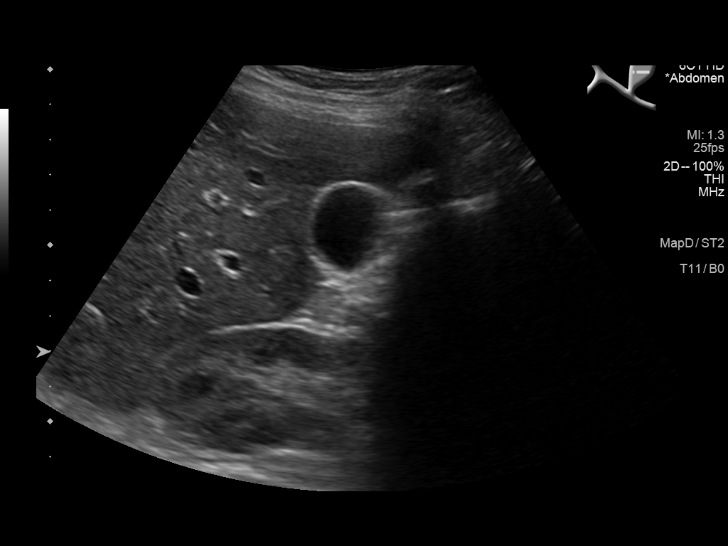
[im 10/40]
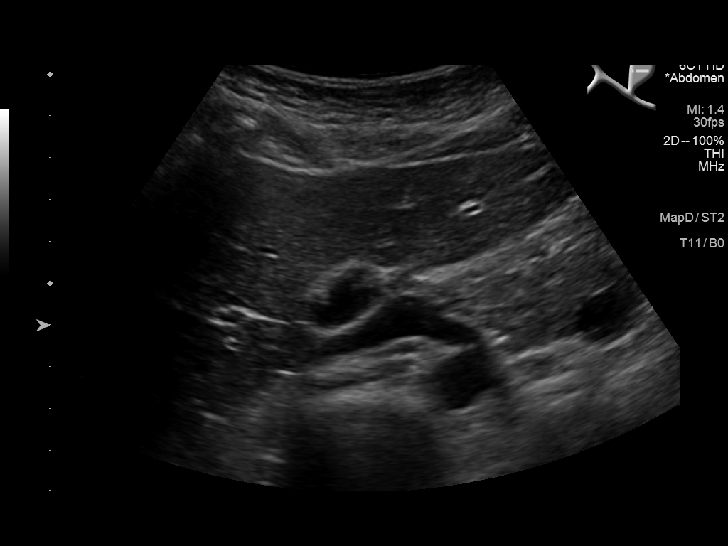
[im 14/40]
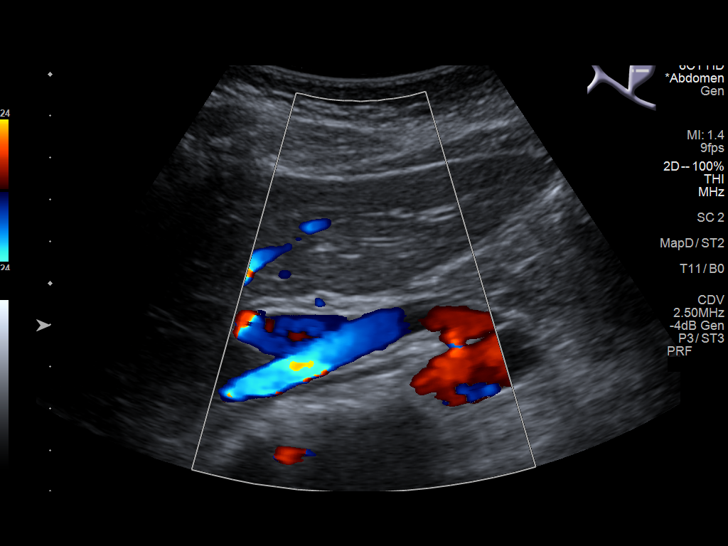
[im 15/40]
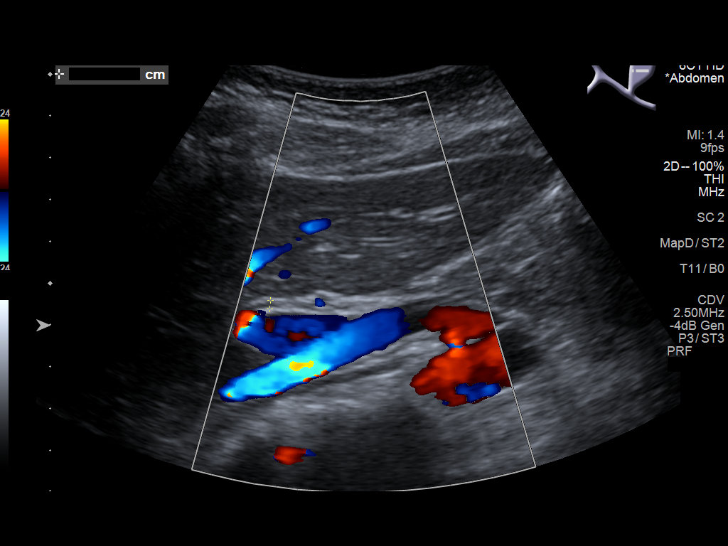
[im 18/40]
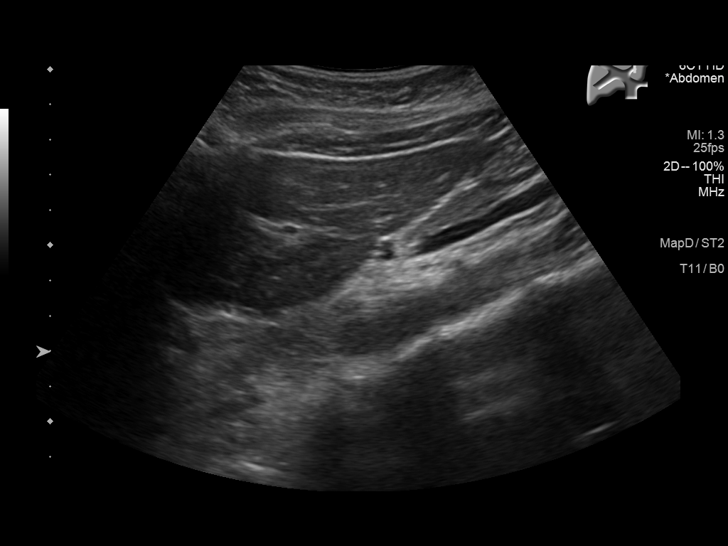
[im 22/40]
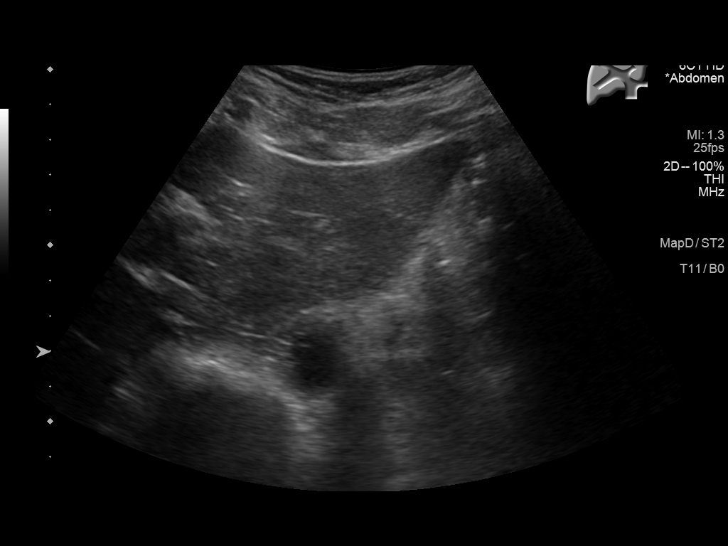
[im 25/40]
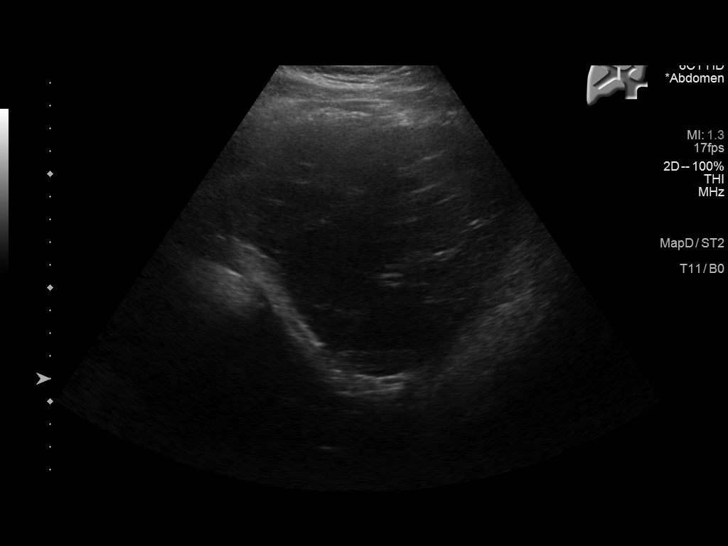
[im 27/40]
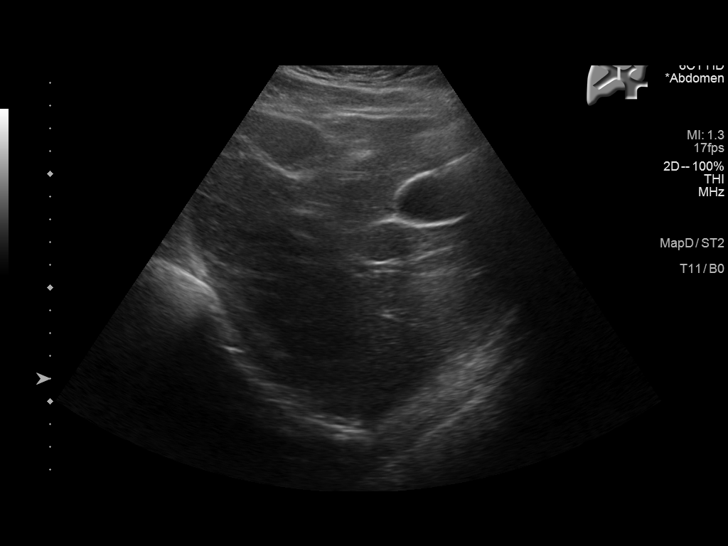
[im 30/40]
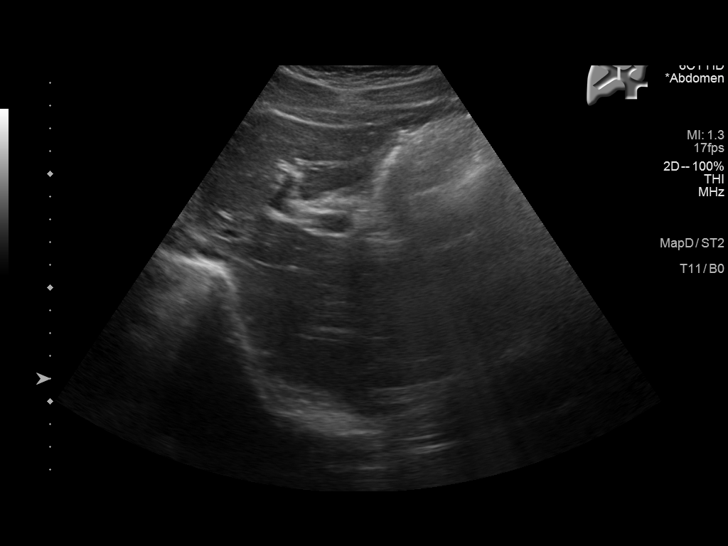
[im 33/40]
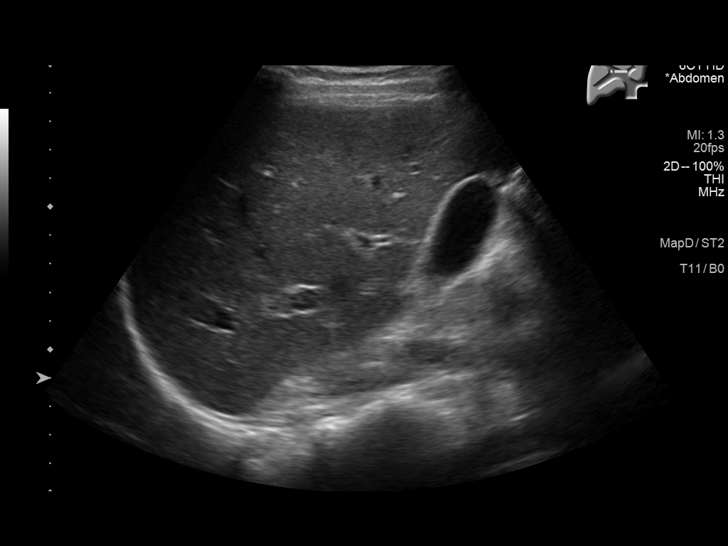
[im 36/40]
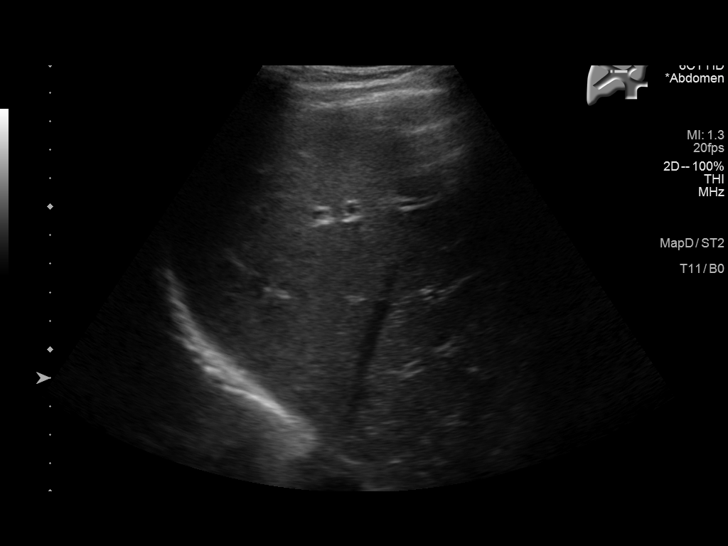
[im 40/40]
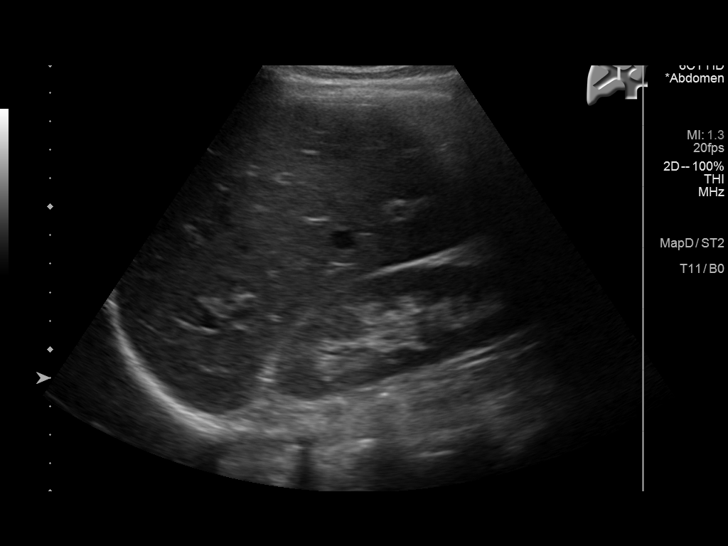

[14 of 25 positions shown; findings below may reference images not displayed]

FINDINGS: Gallbladder:

No gallstones or wall thickening visualized. No sonographic Murphy
sign noted by sonographer.

Common bile duct:

Diameter: 2 mm.

Liver:

No focal lesion identified. Within normal limits in parenchymal
echogenicity. Portal vein is patent on color Doppler imaging with
normal direction of blood flow towards the liver.

Other: None.
IMPRESSION: Normal right upper quadrant ultrasound.

## 2022-04-24 ENCOUNTER — Other Ambulatory Visit: Payer: Self-pay | Admitting: Obstetrics and Gynecology

## 2022-04-24 DIAGNOSIS — Z3041 Encounter for surveillance of contraceptive pills: Secondary | ICD-10-CM

## 2023-01-08 NOTE — Progress Notes (Signed)
PCP:  Duanne Limerick, MD   Chief Complaint  Patient presents with   Gynecologic Exam   Contraception    Discuss Mirena     HPI:      Janet Kane is a 27 y.o. G1P1001 whose LMP was Patient's last menstrual period was 12/18/2022 (exact date)., presents today for her annual examination.  Her menses are regular every 28-30 days, lasting 7 days.  Dysmenorrhea mild. She does not have BTB.  Sex activity: not sexually active since 1/24; contraception - OCP (estrogen/progesterone) in past, stopped 1/24. Would like to restart.  Last Pap: 05/10/20 Results were: no abnormalities  Hx of STDs: none  There is no FH of breast cancer. There is no FH of ovarian cancer. The patient does not do self-breast exams.   Tobacco use: The patient denies current or previous tobacco use. Alcohol use: social drinker No drug use.  Exercise: moderately active  She does get adequate calcium but not Vitamin D in her diet.  Patient Active Problem List   Diagnosis Date Noted   Acute chest pain 12/27/2013   Anxiety 12/27/2013   Health care maintenance 10/03/2011    Past Surgical History:  Procedure Laterality Date   NO PAST SURGERIES      History reviewed. No pertinent family history.  Social History   Socioeconomic History   Marital status: Single    Spouse name: Not on file   Number of children: Not on file   Years of education: Not on file   Highest education level: Not on file  Occupational History   Not on file  Tobacco Use   Smoking status: Never   Smokeless tobacco: Never  Vaping Use   Vaping status: Never Used  Substance and Sexual Activity   Alcohol use: Yes    Alcohol/week: 3.0 standard drinks of alcohol    Types: 3 Standard drinks or equivalent per week   Drug use: Never   Sexual activity: Not Currently    Birth control/protection: None  Other Topics Concern   Not on file  Social History Narrative   Not on file   Social Determinants of Health   Financial  Resource Strain: Not on file  Food Insecurity: Not on file  Transportation Needs: Not on file  Physical Activity: Not on file  Stress: Not on file  Social Connections: Not on file  Intimate Partner Violence: Not on file     Current Outpatient Medications:    drospirenone-ethinyl estradiol (NIKKI) 3-0.02 MG tablet, Take 1 tablet by mouth daily., Disp: 84 tablet, Rfl: 3     ROS:  Review of Systems  Constitutional:  Negative for fatigue, fever and unexpected weight change.  Respiratory:  Negative for cough, shortness of breath and wheezing.   Cardiovascular:  Negative for chest pain, palpitations and leg swelling.  Gastrointestinal:  Negative for blood in stool, constipation, diarrhea, nausea and vomiting.  Endocrine: Negative for cold intolerance, heat intolerance and polyuria.  Genitourinary:  Negative for dyspareunia, dysuria, flank pain, frequency, genital sores, hematuria, menstrual problem, pelvic pain, urgency, vaginal bleeding, vaginal discharge and vaginal pain.  Musculoskeletal:  Negative for back pain, joint swelling and myalgias.  Skin:  Negative for rash.  Neurological:  Negative for dizziness, syncope, light-headedness, numbness and headaches.  Hematological:  Negative for adenopathy.  Psychiatric/Behavioral:  Negative for agitation, confusion, sleep disturbance and suicidal ideas. The patient is not nervous/anxious.    BREAST: no symptoms   Objective: BP 102/70   Ht 5\' 6"  (  1.676 m)   Wt 152 lb (68.9 kg)   LMP 12/18/2022 (Exact Date)   BMI 24.53 kg/m    Physical Exam Constitutional:      Appearance: She is well-developed.  Genitourinary:     Vulva normal.     Right Labia: No rash, tenderness or lesions.    Left Labia: No tenderness, lesions or rash.    No vaginal discharge, erythema or tenderness.      Right Adnexa: not tender and no mass present.    Left Adnexa: not tender and no mass present.    No cervical friability or polyp.     Uterus is not  enlarged or tender.  Breasts:    Right: No mass, nipple discharge, skin change or tenderness.     Left: No mass, nipple discharge, skin change or tenderness.  Neck:     Thyroid: No thyromegaly.  Cardiovascular:     Rate and Rhythm: Normal rate and regular rhythm.     Heart sounds: Normal heart sounds. No murmur heard. Pulmonary:     Effort: Pulmonary effort is normal.     Breath sounds: Normal breath sounds.  Abdominal:     Palpations: Abdomen is soft.     Tenderness: There is no abdominal tenderness. There is no guarding or rebound.  Musculoskeletal:        General: Normal range of motion.     Cervical back: Normal range of motion.  Lymphadenopathy:     Cervical: No cervical adenopathy.  Neurological:     General: No focal deficit present.     Mental Status: She is alert and oriented to person, place, and time.     Cranial Nerves: No cranial nerve deficit.  Skin:    General: Skin is warm and dry.  Psychiatric:        Mood and Affect: Mood normal.        Behavior: Behavior normal.        Thought Content: Thought content normal.        Judgment: Judgment normal.  Vitals reviewed.     Assessment/Plan: Encounter for annual routine gynecological examination  Cervical cancer screening - Plan: Cytology - PAP  Encounter for initial prescription of contraceptive pills - Plan: drospirenone-ethinyl estradiol (NIKKI) 3-0.02 MG tablet; OCR RF eRxd. Start with next menses, condoms for 1 wk.    Meds ordered this encounter  Medications   drospirenone-ethinyl estradiol (NIKKI) 3-0.02 MG tablet    Sig: Take 1 tablet by mouth daily.    Dispense:  84 tablet    Refill:  3    Order Specific Question:   Supervising Provider    Answer:   Waymon Budge             GYN counsel adequate intake of calcium and vitamin D, diet and exercise     F/U  Return in about 1 year (around 01/12/2024).  Anothony Bursch B. Marvene Strohm, PA-C 01/12/2023 5:01 PM

## 2023-01-12 ENCOUNTER — Other Ambulatory Visit (HOSPITAL_COMMUNITY)
Admission: RE | Admit: 2023-01-12 | Discharge: 2023-01-12 | Disposition: A | Discharge status: Home / Self Care | Payer: 59 | Source: Ambulatory Visit | Attending: Obstetrics and Gynecology | Admitting: Obstetrics and Gynecology

## 2023-01-12 ENCOUNTER — Ambulatory Visit (INDEPENDENT_AMBULATORY_CARE_PROVIDER_SITE_OTHER): Payer: 59 | Admitting: Obstetrics and Gynecology

## 2023-01-12 ENCOUNTER — Encounter: Payer: Self-pay | Admitting: Obstetrics and Gynecology

## 2023-01-12 VITALS — BP 102/70 | Ht 66.0 in | Wt 152.0 lb

## 2023-01-12 DIAGNOSIS — Z124 Encounter for screening for malignant neoplasm of cervix: Secondary | ICD-10-CM | POA: Insufficient documentation

## 2023-01-12 DIAGNOSIS — Z01419 Encounter for gynecological examination (general) (routine) without abnormal findings: Secondary | ICD-10-CM | POA: Diagnosis not present

## 2023-01-12 DIAGNOSIS — Z30011 Encounter for initial prescription of contraceptive pills: Secondary | ICD-10-CM

## 2023-01-12 DIAGNOSIS — Z3041 Encounter for surveillance of contraceptive pills: Secondary | ICD-10-CM

## 2023-01-12 MED ORDER — DROSPIRENONE-ETHINYL ESTRADIOL 3-0.02 MG PO TABS: 1.0000 | ORAL_TABLET | Freq: Every day | ORAL | 3 refills | Status: DC

## 2023-01-12 NOTE — Patient Instructions (Signed)
I value your feedback and you entrusting us with your care. If you get a [facility] patient survey, I would appreciate you taking the time to let us know about your experience today. Thank you! ? ? ?

## 2023-01-14 LAB — CYTOLOGY - PAP
Adequacy: ABSENT
Diagnosis: NEGATIVE

## 2023-12-18 ENCOUNTER — Other Ambulatory Visit: Payer: Self-pay | Admitting: Obstetrics and Gynecology

## 2023-12-18 DIAGNOSIS — Z30011 Encounter for initial prescription of contraceptive pills: Secondary | ICD-10-CM

## 2023-12-21 ENCOUNTER — Other Ambulatory Visit: Payer: Self-pay | Admitting: Obstetrics and Gynecology

## 2023-12-21 DIAGNOSIS — Z30011 Encounter for initial prescription of contraceptive pills: Secondary | ICD-10-CM

## 2023-12-22 ENCOUNTER — Other Ambulatory Visit: Payer: Self-pay

## 2023-12-22 DIAGNOSIS — Z30011 Encounter for initial prescription of contraceptive pills: Secondary | ICD-10-CM

## 2023-12-22 MED ORDER — DROSPIRENONE-ETHINYL ESTRADIOL 3-0.02 MG PO TABS: 1.0000 | ORAL_TABLET | Freq: Every day | ORAL | 0 refills | Status: DC

## 2023-12-23 ENCOUNTER — Telehealth: Admitting: Physician Assistant

## 2023-12-23 DIAGNOSIS — R3989 Other symptoms and signs involving the genitourinary system: Secondary | ICD-10-CM

## 2023-12-23 MED ORDER — CEPHALEXIN 500 MG PO CAPS: 500.0000 mg | ORAL_CAPSULE | Freq: Two times a day (BID) | ORAL | 0 refills | Status: AC

## 2023-12-23 NOTE — Progress Notes (Signed)

## 2023-12-23 NOTE — Progress Notes (Signed)
 I have spent 5 minutes in review of e-visit questionnaire, review and updating patient chart, medical decision making and response to patient.   Elsie Velma Lunger, PA-C

## 2024-01-07 ENCOUNTER — Telehealth: Admitting: Physician Assistant

## 2024-01-07 DIAGNOSIS — B379 Candidiasis, unspecified: Secondary | ICD-10-CM | POA: Diagnosis not present

## 2024-01-07 DIAGNOSIS — T3695XA Adverse effect of unspecified systemic antibiotic, initial encounter: Secondary | ICD-10-CM | POA: Diagnosis not present

## 2024-01-07 MED ORDER — FLUCONAZOLE 150 MG PO TABS: ORAL_TABLET | ORAL | 0 refills | Status: DC

## 2024-01-07 NOTE — Progress Notes (Signed)

## 2024-01-07 NOTE — Progress Notes (Signed)
 I have spent 5 minutes in review of e-visit questionnaire, review and updating patient chart, medical decision making and response to patient.   Elsie Velma Lunger, PA-C

## 2024-01-14 ENCOUNTER — Other Ambulatory Visit: Payer: Self-pay | Admitting: Obstetrics and Gynecology

## 2024-01-14 DIAGNOSIS — Z30011 Encounter for initial prescription of contraceptive pills: Secondary | ICD-10-CM

## 2024-01-19 NOTE — Progress Notes (Unsigned)
 PCP:  Joshua Cathryne BROCKS, MD (Inactive)   No chief complaint on file.    HPI:      [name] is a 28 y.o. G1P1001 whose LMP was No LMP recorded., presents today for her annual examination.  Her menses are regular every 28-30 days, lasting 7 days.  Dysmenorrhea mild. She does not have BTB.  Sex activity: not sexually active since 1/24; contraception - OCP (estrogen/progesterone) in past, stopped 1/24. Would like to restart.  Last Pap: 01/12/23 Results were: no abnormalities  Hx of STDs: none  There is no FH of breast cancer. There is no FH of ovarian cancer. The patient does not do self-breast exams.   Tobacco use: The patient denies current or previous tobacco use. Alcohol use: social drinker No drug use.  Exercise: moderately active  She does get adequate calcium but not Vitamin D in her diet.  Patient Active Problem List   Diagnosis Date Noted   Acute chest pain 12/27/2013   Anxiety 12/27/2013   Health care maintenance 10/03/2011    Past Surgical History:  Procedure Laterality Date   NO PAST SURGERIES      No family history on file.  Social History   Socioeconomic History   Marital status: Single    Spouse name: Not on file   Number of children: Not on file   Years of education: Not on file   Highest education level: Not on file  Occupational History   Not on file  Tobacco Use   Smoking status: Never   Smokeless tobacco: Never  Vaping Use   Vaping status: Never Used  Substance and Sexual Activity   Alcohol use: Yes    Alcohol/week: 3.0 standard drinks of alcohol    Types: 3 Standard drinks or equivalent per week   Drug use: Never   Sexual activity: Not Currently    Birth control/protection: None  Other Topics Concern   Not on file  Social History Narrative   Not on file   Social Drivers of Health   Financial Resource Strain: Not on file  Food Insecurity: Not on file  Transportation Needs: Not on file  Physical Activity: Not on file   Stress: Not on file  Social Connections: Not on file  Intimate Partner Violence: Not on file     Current Outpatient Medications:    drospirenone -ethinyl estradiol  (NIKKI ) 3-0.02 MG tablet, TAKE 1 TABLET BY MOUTH EVERY DAY, Disp: 84 tablet, Rfl: 0   fluconazole  (DIFLUCAN ) 150 MG tablet, Take 1 tablet PO once. Repeat in 3 days if needed., Disp: 2 tablet, Rfl: 0     ROS:  Review of Systems  Constitutional:  Negative for fatigue, fever and unexpected weight change.  Respiratory:  Negative for cough, shortness of breath and wheezing.   Cardiovascular:  Negative for chest pain, palpitations and leg swelling.  Gastrointestinal:  Negative for blood in stool, constipation, diarrhea, nausea and vomiting.  Endocrine: Negative for cold intolerance, heat intolerance and polyuria.  Genitourinary:  Negative for dyspareunia, dysuria, flank pain, frequency, genital sores, hematuria, menstrual problem, pelvic pain, urgency, vaginal bleeding, vaginal discharge and vaginal pain.  Musculoskeletal:  Negative for back pain, joint swelling and myalgias.  Skin:  Negative for rash.  Neurological:  Negative for dizziness, syncope, light-headedness, numbness and headaches.  Hematological:  Negative for adenopathy.  Psychiatric/Behavioral:  Negative for agitation, confusion, sleep disturbance and suicidal ideas. The patient is not nervous/anxious.    BREAST: no symptoms   Objective: There were  no vitals taken for this visit.   Physical Exam Constitutional:      Appearance: She is well-developed.  Genitourinary:     Vulva normal.     Right Labia: No rash, tenderness or lesions.    Left Labia: No tenderness, lesions or rash.    No vaginal discharge, erythema or tenderness.      Right Adnexa: not tender and no mass present.    Left Adnexa: not tender and no mass present.    No cervical friability or polyp.     Uterus is not enlarged or tender.  Breasts:    Right: No mass, nipple discharge, skin  change or tenderness.     Left: No mass, nipple discharge, skin change or tenderness.  Neck:     Thyroid: No thyromegaly.  Cardiovascular:     Rate and Rhythm: Normal rate and regular rhythm.     Heart sounds: Normal heart sounds. No murmur heard. Pulmonary:     Effort: Pulmonary effort is normal.     Breath sounds: Normal breath sounds.  Abdominal:     Palpations: Abdomen is soft.     Tenderness: There is no abdominal tenderness. There is no guarding or rebound.  Musculoskeletal:        General: Normal range of motion.     Cervical back: Normal range of motion.  Lymphadenopathy:     Cervical: No cervical adenopathy.  Neurological:     General: No focal deficit present.     Mental Status: She is alert and oriented to person, place, and time.     Cranial Nerves: No cranial nerve deficit.  Skin:    General: Skin is warm and dry.  Psychiatric:        Mood and Affect: Mood normal.        Behavior: Behavior normal.        Thought Content: Thought content normal.        Judgment: Judgment normal.  Vitals reviewed.     Assessment/Plan: Encounter for annual routine gynecological examination  Cervical cancer screening - Plan: Cytology - PAP  Encounter for initial prescription of contraceptive pills - Plan: drospirenone -ethinyl estradiol  (NIKKI ) 3-0.02 MG tablet; OCR RF eRxd. Start with next menses, condoms for 1 wk.    No orders of the defined types were placed in this encounter.            GYN counsel adequate intake of calcium and vitamin D, diet and exercise     F/U  No follow-ups on file.  Modest Draeger B. Kaliq Lege, PA-C 01/19/2024 9:00 AM

## 2024-01-21 ENCOUNTER — Encounter: Payer: Self-pay | Admitting: Obstetrics and Gynecology

## 2024-01-21 ENCOUNTER — Ambulatory Visit: Admitting: Obstetrics and Gynecology

## 2024-01-21 VITALS — BP 105/69 | HR 62 | Ht 66.0 in | Wt 150.0 lb

## 2024-01-21 DIAGNOSIS — Z3041 Encounter for surveillance of contraceptive pills: Secondary | ICD-10-CM

## 2024-01-21 DIAGNOSIS — Z01419 Encounter for gynecological examination (general) (routine) without abnormal findings: Secondary | ICD-10-CM

## 2024-01-21 MED ORDER — DROSPIRENONE-ETHINYL ESTRADIOL 3-0.02 MG PO TABS: 1.0000 | ORAL_TABLET | Freq: Every day | ORAL | 3 refills | Status: AC

## 2024-01-21 NOTE — Patient Instructions (Signed)
 I value your feedback and you entrusting [place] with your care. If you get a [facility] patient survey, I would appreciate you taking the time to let us know about your experience today. Thank you! ? ? ?

## 2024-06-02 ENCOUNTER — Ambulatory Visit: Admitting: Medical

## 2024-10-12 ENCOUNTER — Ambulatory Visit: Admitting: Family Medicine
